# Patient Record
Sex: Female | Born: 1980 | Race: White | Hispanic: No | Marital: Married
Health system: Midwestern US, Community
[De-identification: ages and names within clinical notes are randomized; demographics above are authoritative.]

## PROBLEM LIST (undated history)

## (undated) DIAGNOSIS — I10 Essential (primary) hypertension: Secondary | ICD-10-CM

## (undated) DIAGNOSIS — N6012 Diffuse cystic mastopathy of left breast: Secondary | ICD-10-CM

## (undated) DIAGNOSIS — Z1231 Encounter for screening mammogram for malignant neoplasm of breast: Secondary | ICD-10-CM

## (undated) DIAGNOSIS — R928 Other abnormal and inconclusive findings on diagnostic imaging of breast: Secondary | ICD-10-CM

## (undated) DIAGNOSIS — N898 Other specified noninflammatory disorders of vagina: Secondary | ICD-10-CM

## (undated) DIAGNOSIS — N644 Mastodynia: Secondary | ICD-10-CM

## (undated) DIAGNOSIS — N6011 Diffuse cystic mastopathy of right breast: Secondary | ICD-10-CM

## (undated) DIAGNOSIS — N938 Other specified abnormal uterine and vaginal bleeding: Secondary | ICD-10-CM

## (undated) DIAGNOSIS — R9230 Dense breasts, unspecified: Secondary | ICD-10-CM

## (undated) DIAGNOSIS — R922 Inconclusive mammogram: Secondary | ICD-10-CM

## (undated) DIAGNOSIS — Z01419 Encounter for gynecological examination (general) (routine) without abnormal findings: Secondary | ICD-10-CM

## (undated) DIAGNOSIS — Z1239 Encounter for other screening for malignant neoplasm of breast: Secondary | ICD-10-CM

---

## 2006-04-30 ENCOUNTER — Ambulatory Visit: Payer: Self-pay | Admitting: Gynecology

## 2008-06-20 ENCOUNTER — Emergency Department: Payer: Self-pay | Admitting: Emergency Medicine

## 2008-09-23 ENCOUNTER — Emergency Department: Payer: Self-pay | Admitting: Internal Medicine

## 2009-03-17 ENCOUNTER — Emergency Department: Payer: Self-pay | Admitting: Emergency Medicine

## 2009-08-23 ENCOUNTER — Emergency Department: Payer: Self-pay | Admitting: Emergency Medicine

## 2010-02-09 ENCOUNTER — Ambulatory Visit: Payer: Self-pay | Admitting: Family Medicine

## 2013-01-14 ENCOUNTER — Emergency Department: Payer: Self-pay | Admitting: Emergency Medicine

## 2013-01-14 LAB — COMPREHENSIVE METABOLIC PANEL
Albumin: 4.3 g/dL (ref 3.4–5.0)
Alkaline Phosphatase: 55 U/L (ref 50–136)
Bilirubin,Total: 0.3 mg/dL (ref 0.2–1.0)
Co2: 22 mmol/L (ref 21–32)
Creatinine: 0.86 mg/dL (ref 0.60–1.30)
Glucose: 81 mg/dL (ref 65–99)
Osmolality: 273 (ref 275–301)
SGOT(AST): 23 U/L (ref 15–37)
Sodium: 138 mmol/L (ref 136–145)
Total Protein: 8.3 g/dL — ABNORMAL HIGH (ref 6.4–8.2)

## 2013-01-14 LAB — CBC
HGB: 13.3 g/dL (ref 12.0–16.0)
MCH: 28.3 pg (ref 26.0–34.0)
MCHC: 32.6 g/dL (ref 32.0–36.0)
MCV: 87 fL (ref 80–100)
RBC: 4.7 10*6/uL (ref 3.80–5.20)
RDW: 14 % (ref 11.5–14.5)
WBC: 10.1 10*3/uL (ref 3.6–11.0)

## 2013-01-14 LAB — URINALYSIS, COMPLETE
Bacteria: NONE SEEN
Bilirubin,UR: NEGATIVE
Ketone: NEGATIVE
Nitrite: NEGATIVE
Protein: NEGATIVE
RBC,UR: 1 /HPF (ref 0–5)
Specific Gravity: 1.023 (ref 1.003–1.030)

## 2013-01-14 LAB — LIPASE, BLOOD: Lipase: 75 U/L (ref 73–393)

## 2013-02-19 ENCOUNTER — Emergency Department: Payer: Self-pay | Admitting: Internal Medicine

## 2013-03-14 ENCOUNTER — Emergency Department: Payer: Self-pay | Admitting: Psychiatry

## 2013-03-14 LAB — URINALYSIS, COMPLETE
Bilirubin,UR: NEGATIVE
Glucose,UR: NEGATIVE mg/dL (ref 0–75)
Ketone: NEGATIVE
Protein: 100
RBC,UR: 1059 /HPF (ref 0–5)
Specific Gravity: 1.01 (ref 1.003–1.030)
Squamous Epithelial: 6
WBC UR: 1312 /HPF (ref 0–5)

## 2015-11-12 ENCOUNTER — Emergency Department: Payer: BLUE CROSS/BLUE SHIELD

## 2015-11-12 ENCOUNTER — Emergency Department
Admission: EM | Admit: 2015-11-12 | Discharge: 2015-11-12 | Disposition: A | Discharge status: Home / Self Care | Payer: BLUE CROSS/BLUE SHIELD | Attending: Emergency Medicine | Admitting: Emergency Medicine

## 2015-11-12 DIAGNOSIS — R079 Chest pain, unspecified: Secondary | ICD-10-CM | POA: Diagnosis present

## 2015-11-12 DIAGNOSIS — I10 Essential (primary) hypertension: Secondary | ICD-10-CM | POA: Insufficient documentation

## 2015-11-12 DIAGNOSIS — F17200 Nicotine dependence, unspecified, uncomplicated: Secondary | ICD-10-CM | POA: Diagnosis not present

## 2015-11-12 HISTORY — DX: Essential (primary) hypertension: I10

## 2015-11-12 LAB — CBC
HCT: 40.7 % (ref 35.0–47.0)
HEMOGLOBIN: 13.6 g/dL (ref 12.0–16.0)
MCH: 30 pg (ref 26.0–34.0)
MCHC: 33.5 g/dL (ref 32.0–36.0)
MCV: 89.6 fL (ref 80.0–100.0)
PLATELETS: 252 10*3/uL (ref 150–440)
RBC: 4.54 MIL/uL (ref 3.80–5.20)
RDW: 13.9 % (ref 11.5–14.5)
WBC: 9.7 10*3/uL (ref 3.6–11.0)

## 2015-11-12 LAB — TROPONIN I: Troponin I: <0.03 ng/mL (ref <0.031)

## 2015-11-12 LAB — COMPREHENSIVE METABOLIC PANEL
ALBUMIN: 4.4 g/dL (ref 3.5–5.0)
ALK PHOS: 38 U/L (ref 38–126)
ALT: 13 U/L — ABNORMAL LOW (ref 14–54)
ANION GAP: 8 (ref 5–15)
AST: 16 U/L (ref 15–41)
BILIRUBIN TOTAL: 0.3 mg/dL (ref 0.3–1.2)
BUN: 11 mg/dL (ref 6–20)
CALCIUM: 9.1 mg/dL (ref 8.9–10.3)
CO2: 24 mmol/L (ref 22–32)
CREATININE: 0.89 mg/dL (ref 0.44–1.00)
Chloride: 107 mmol/L (ref 101–111)
Glucose, Bld: 96 mg/dL (ref 65–99)
Potassium: 4.2 mmol/L (ref 3.5–5.1)
SODIUM: 139 mmol/L (ref 135–145)
TOTAL PROTEIN: 7.7 g/dL (ref 6.5–8.1)

## 2015-11-12 LAB — FIBRIN DERIVATIVES D-DIMER (ARMC ONLY): FIBRIN DERIVATIVES D-DIMER (ARMC): 263 (ref 0–499)

## 2015-11-12 MED ORDER — ALUM & MAG HYDROXIDE-SIMETH 200-200-20 MG/5ML PO SUSP
15.0000 mL | ORAL | Status: AC
Start: 2015-11-12 — End: 2015-11-12
  Administered 2015-11-12: 15 mL via ORAL
  Filled 2015-11-12: qty 30

## 2015-11-12 MED ORDER — HYDROCHLOROTHIAZIDE 12.5 MG PO CAPS
12.5000 mg | ORAL_CAPSULE | Freq: Every day | ORAL | Status: DC
  Administered 2015-11-12: 12.5 mg via ORAL
  Filled 2015-11-12: qty 1

## 2015-11-12 MED ORDER — ASPIRIN 81 MG PO CHEW
324.0000 mg | CHEWABLE_TABLET | Freq: Once | ORAL | Status: AC
  Administered 2015-11-12: 324 mg via ORAL
  Filled 2015-11-12: qty 4

## 2015-11-12 NOTE — ED Provider Notes (Signed)
[facility] Emergency Department Provider Note  ____________________________________________  Time seen: Approximately 1:33 PM  I have reviewed the triage vital signs and the nursing notes.   HISTORY  Chief Complaint Chest Pain    HPI Kellie West is a 35 y.o. female presents for evaluation of intermittent "contraction" like discomfort that she is been experiencing across the chest for about the last 4 days.  Patient reports that throughout the day she feels a slight spasming or contraction pain across her chest. It is not associated with exertion, and she reports that was relieved somewhat with Tums that she felt like it may be indigestion.  She does have a history of high blood pressure and is a smoker from time to time, but she denies any history of heart disease. Denies early-onset heart disease in the family. No history of any previous blood clots, leg swelling, cough, or trouble breathing. She does take estrogen.  The present time she reports she is not having any further symptoms.   Past Medical History  Diagnosis Date  . Hypertension     There are no active problems to display for this patient.   No past surgical history on file.  No current outpatient prescriptions on file.  Allergies Review of patient's allergies indicates no known allergies.  No family history on file.  Social History Social History  Substance Use Topics  . Smoking status: Current Some Day Smoker  . Smokeless tobacco: Not on file  . Alcohol Use: No    Review of Systems Constitutional: No fever/chills Eyes: No visual changes. ENT: No sore throat. Cardiovascular: See history of present illness Respiratory: Denies shortness of breath. Gastrointestinal: No abdominal pain.  No nausea, no vomiting.  No diarrhea.  No constipation. Genitourinary: Negative for dysuria. Musculoskeletal: Negative for back pain. Skin: Negative for rash. Neurological: Negative for  headaches, focal weakness or numbness.  10-point ROS otherwise negative.  ____________________________________________   PHYSICAL EXAM:  VITAL SIGNS: ED Triage Vitals  Enc Vitals Group     BP 11/12/15 0839 160/104 mmHg     Pulse Rate 11/12/15 0831 84     Resp --      Temp 11/12/15 0831 98.7 F (37.1 C)     Temp Source 11/12/15 0831 Oral     SpO2 11/12/15 0831 99 %     Weight 11/12/15 0831 180 lb (81.647 kg)     Height 11/12/15 0831  (1.651 m)     Head Cir --      Peak Flow --      Pain Score --      Pain Loc --      Pain Edu? --      Excl. in GC? --    Constitutional: Alert and oriented. Well appearing and in no acute distress. Very pleasant, amicable seated comfortably in stretcher. Eyes: Conjunctivae are normal. PERRL. Head: Atraumatic. Nose: No congestion/rhinnorhea. Mouth/Throat: Mucous membranes are moist.  Oropharynx non-erythematous. Neck: No stridor.   Cardiovascular: Normal rate, regular rhythm. Grossly normal heart sounds.  Good peripheral circulation. Respiratory: Normal respiratory effort.  No retractions. Lungs CTAB. Gastrointestinal: Soft and nontender. No distention. Negative Murphy. Musculoskeletal: No lower extremity tenderness nor edema.  Neurologic:  Normal speech and language. No gross focal neurologic deficits are appreciated. No gait instability. Skin:  Skin is warm, dry and intact. No rash noted. Psychiatric: Mood and affect are normal. Speech and behavior are normal.  ____________________________________________   LABS (all labs ordered are listed, but  only abnormal results are displayed)  Labs Reviewed  COMPREHENSIVE METABOLIC PANEL - Abnormal; Notable for the following:    ALT 13 (*)    All other components within normal limits  CBC  TROPONIN I  FIBRIN DERIVATIVES D-DIMER ([facility] ONLY)  TROPONIN I   ____________________________________________  EKG  Reviewed and interpreted me at 8:25 AM Treatment rate 90 PR 150 QRS 80 QTc  440 No evidence of acute ischemic T-wave abnormality, patient does have fairly large voltage along V1 through V3 which would be suspicious for possible left ventricular hypertrophy. No evidence of acute ischemic abnormality. ____________________________________________  RADIOLOGY      DG Chest 2 View (Final result) Result time: 11/12/15 13:48:46   Final result by Rad Results In Interface (11/12/15 13:48:46)   Narrative:   CLINICAL DATA: 35 year old female with chest pain for 5 days.  EXAM: CHEST 2 VIEW  COMPARISON: None.  FINDINGS: The cardiomediastinal silhouette is unremarkable.  There is no evidence of focal airspace disease, pulmonary edema, suspicious pulmonary nodule/mass, pleural effusion, or pneumothorax. No acute bony abnormalities are identified.  IMPRESSION: No active cardiopulmonary disease.   Electronically Signed By: Harmon Pier M.D. On: 11/12/2015 13:48    ____________________________________________   PROCEDURES  Procedure(s) performed: None  Critical Care performed: No  ____________________________________________   INITIAL IMPRESSION / ASSESSMENT AND PLAN / ED COURSE  Pertinent labs & imaging results that were available during my care of the patient were reviewed by me and considered in my medical decision making (see chart for details).  Patient presents for intermittent chest discomfort. Atypical in its symptoms and that is been off and on, associated with mild squeezing discomfort but not associated with exertional or other acute concerns. It is not radiating that she did report that she did feel sweaty with an episode earlier during the morning. Presently asymptomatic. EKG reassuring for no acute ischemic abnormality, 2 negative troponins. She is low risk by well's clinical criteria for pulmonary embolism, and her d-dimer is less than 500. In addition the patient has no signs or symptoms of acute aortic dissection. No ripping tearing  or moving pain. Chest x-ray normal. No evidence of abdominal pain.  Discussed with cardiology, patient sit up with a plan with Dr. Lady Gary Wednesday. Patient agreeable to follow-up and close return precautions. Currently pain and symptom free. Low risk by heart score.  Return precautions and treatment recommendations and follow-up discussed with the patient who is agreeable with the plan.  ____________________________________________   FINAL CLINICAL IMPRESSION(S) / ED DIAGNOSES  Final diagnoses:  Chest pain with low risk for cardiac etiology      Sharyn Creamer, MD 11/12/15 [phone]

## 2015-11-12 NOTE — ED Notes (Addendum)
Patient states that on Sunday night she started having chest pain, Pain is located in the center of her chest, does not radiate, pain comes and goes, feels like "a contraction but in my chest". Pain wakes her up at night, patient states that when she has the pain she breaks out in sweats. Denies Shortness of breath or nausea.   Patient has a hx/o hypertension but does not take any medication for it.

## 2015-11-12 NOTE — ED Notes (Signed)
Pt drove here this am  She reports  "I have been having this pain in the middle of my chest - it has been waking me up and I get really sweaty"  0/10 pain upon arrival

## 2015-11-12 NOTE — Discharge Instructions (Signed)
You have been seen in the Emergency Department (ED) today for chest pain.  As we have discussed today’s test results are normal, but you may require further testing. ° °Please follow up with the recommended doctor as instructed above in these documents regarding today’s emergent visit and your recent symptoms to discuss further management.  Continue to take your regular medications. If you are not doing so already, please also take a daily baby aspirin (81 mg), at least until you follow up with your doctor. ° °Return to the Emergency Department (ED) if you experience any further chest pain/pressure/tightness, difficulty breathing, or sudden sweating, or other symptoms that concern you. ° ° °Chest Pain Observation °It is often hard to give a specific diagnosis for the cause of chest pain. Among other possibilities your symptoms might be caused by inadequate oxygen delivery to your heart (angina). Angina that is not treated or evaluated can lead to a heart attack (myocardial infarction) or death. °Blood tests, electrocardiograms, and X-rays may have been done to help determine a possible cause of your chest pain. After evaluation and observation, your health care provider has determined that it is unlikely your pain was caused by an unstable condition that requires hospitalization. However, a full evaluation of your pain may need to be completed, with additional diagnostic testing as directed. It is very important to keep your follow-up appointments. Not keeping your follow-up appointments could result in permanent heart damage, disability, or death. If there is any problem keeping your follow-up appointments, you must call your health care provider. °HOME CARE INSTRUCTIONS  °Due to the slight chance that your pain could be angina, it is important to follow your health care provider's treatment plan and also maintain a healthy lifestyle: °· Maintain or work toward achieving a healthy weight. °· Stay physically active  and exercise regularly. °· Decrease your salt intake. °· Eat a balanced, healthy diet. Talk to a dietitian to learn about heart-healthy foods. °· Increase your fiber intake by including whole grains, vegetables, fruits, and nuts in your diet. °· Avoid situations that cause stress, anger, or depression. °· Take medicines as advised by your health care provider. Report any side effects to your health care provider. Do not stop medicines or adjust the dosages on your own. °· Quit smoking. Do not use nicotine patches or gum until you check with your health care provider. °· Keep your blood pressure, blood sugar, and cholesterol levels within normal limits. °· Limit alcohol intake to no more than 1 drink per day for women who are not pregnant and 2 drinks per day for men. °· Do not abuse drugs. °SEEK IMMEDIATE MEDICAL CARE IF: °You have severe chest pain or pressure which may include symptoms such as: °· You feel pain or pressure in your arms, neck, jaw, or back. °· You have severe back or abdominal pain, feel sick to your stomach (nauseous), or throw up (vomit). °· You are sweating profusely. °· You are having a fast or irregular heartbeat. °· You feel short of breath while at rest. °· You notice increasing shortness of breath during rest, sleep, or with activity. °· You have chest pain that does not get better after rest or after taking your usual medicine. °· You wake from sleep with chest pain. °· You are unable to sleep because you cannot breathe. °· You develop a frequent cough or you are coughing up blood. °· You feel dizzy, faint, or experience extreme fatigue. °· You develop severe weakness, dizziness, fainting,   or chills. °Any of these symptoms may represent a serious problem that is an emergency. Do not wait to see if the symptoms will go away. Call your local emergency services (911 in the [place] Do not drive yourself to the hospital. °MAKE SURE YOU: °· Understand these instructions. °· Will watch your  condition. °· Will get help right away if you are not doing well or get worse. °  °This information is not intended to replace advice given to you by your health care provider. Make sure you discuss any questions you have with your health care provider. °  °Document Released: 10/21/2010 Document Revised: 09/23/2013 Document Reviewed: 03/20/2013 °Elsevier Interactive Patient Education ©2016 [employer] ° °

## 2017-02-01 ENCOUNTER — Ambulatory Visit
Admit: 2017-02-01 | Discharge: 2017-02-01 | Payer: PRIVATE HEALTH INSURANCE | Attending: Obstetrics & Gynecology | Primary: Internal Medicine

## 2017-02-01 ENCOUNTER — Encounter

## 2017-02-01 DIAGNOSIS — Z01419 Encounter for gynecological examination (general) (routine) without abnormal findings: Secondary | ICD-10-CM

## 2017-02-01 LAB — CBC WITH AUTO DIFFERENTIAL
Basophils %: 1.1 %
Basophils Absolute: 0.1 10*3/uL (ref 0.0–0.2)
Eosinophils %: 3 %
Eosinophils Absolute: 0.2 10*3/uL (ref 0.0–0.7)
Hematocrit: 37.7 % (ref 37.0–47.0)
Hemoglobin: 12.7 g/dL (ref 12.0–16.0)
Lymphocytes %: 45.2 %
Lymphocytes Absolute: 2.5 10*3/uL (ref 1.0–4.8)
MCH: 31.3 pg (ref 27.0–31.3)
MCHC: 33.7 % (ref 33.0–37.0)
MCV: 93 fL (ref 82.0–100.0)
Monocytes %: 8.8 %
Monocytes Absolute: 0.5 10*3/uL (ref 0.2–0.8)
Neutrophils %: 41.9 %
Neutrophils Absolute: 2.3 10*3/uL (ref 1.4–6.5)
Platelets: 194 10*3/uL (ref 130–400)
RBC: 4.06 M/uL — ABNORMAL LOW (ref 4.20–5.40)
RDW: 12.7 % (ref 11.5–14.5)
WBC: 5.4 10*3/uL (ref 4.8–10.8)

## 2017-02-01 LAB — T4, FREE: T4 Free: 1.01 ng/dL (ref 0.93–1.70)

## 2017-02-01 LAB — TSH: TSH: 2.39 u[IU]/mL (ref 0.270–4.200)

## 2017-02-01 LAB — LUTEINIZING HORMONE: LH: 7.6 m[IU]/mL

## 2017-02-01 LAB — HCG, QUANTITATIVE, PREGNANCY: hCG Quant: <0.1 m[IU]/mL

## 2017-02-01 LAB — PROLACTIN: Prolactin: 8.2 ng/mL

## 2017-02-01 LAB — FOLLICLE STIMULATING HORMONE: FSH: 3.8 m[IU]/mL

## 2017-02-01 NOTE — Patient Instructions (Signed)
ENDOSEE - OFFICE HYSTEROSCOPY    Endosee Office Hysteroscopy is a diagnostic tool that lets your doctor see the inside of your uterus quickly and easily right in her office, without the need for general anesthesia in an operating room.  Endosee helps find the cause of several common issues women face such as abnormal bleeding and infertility concerns.  Before using Endosee, your doctor will need to look at your health history, but most women are able to have the procedure without a problem.    Do I need to do anything prior to my Endosee?  1.  No unprotected intercourse for 3 weeks prior to procedure  2.  Take 800mg of Motrin 1 hour prior to your procedure  3.  If you start your period, you can still have the Endosee done if your bleeding is very light.  If your cycle is heavy please call to reschedule.  4.  Avoid vaginal medications or creams & douching for 24 hours before the procedure.  5. Please come prepared to leave a urine sample prior to your procedure.    What can I expect when I go home?  1.  You may have some spotting or light discharge for up to 24 hours.  2.  You can resume all normal activities.  3.  The procedure may start your period.    *If a biopsy is done, you will need to schedule a follow up appointment for 2 weeks following your procedure to get your results from the physician.          EMBX---      You have been scheduled for an Endometrial Biopsy (EMBX).  An EMBX is a procedure that involves using a soft, straw-like device to suction a small sample of lining from the uterus.  An EMBX is done to find the cause of heavy, prolonged, or irregular uterine bleeding.  It is also done to find the cause of uterine bleeding in women who have gone through menopause.    Do I need anything prior to my EMBX?  1.  No unprotected intercourse for 3-4 weeks prior to procedure  2.  Take 800mg of Motrin 1 hour prior to your procedure  3.  If you start your period you can still have the procedure if you are  having light bleeding such as the first or last day of your cycle.    What can I expect when I go home?  1.  You may have pink or red tinged discharge after the procedure for up to 24 hours.  2.  This procedure may cause your period to start.  3.  If you have heavy bleeding (filling an overnight pad in 1 hour or less for 3 hrs), a fever of 100 degrees (or higher), or abdominal pain that is debilitating, please call the office immediately at [phone].

## 2017-02-01 NOTE — Progress Notes (Signed)
History and Physical  [facility] [place]  [address] [place], [place] [address]  P: [phone] / F: [phone]  Lindsay Lawson  02/01/2017              36 y.o.  Chief Complaint   Patient presents with   . Endometriosis     Pt reports periods lasting 10-20 days with "insane" cramps. c/o heavy bleeding/blood clot with severe abd pain.    . New Patient   . Dyspareunia     pt states she has pain with sex. states at times she has to refrain at time from interocourse.        BP 110/60   Ht 5\' 11"  (1.803 m)   Wt 175 lb 12.8 oz (79.7 kg)   LMP 01/14/2017   BMI 24.52 kg/m   Alllergies:  Latex; Adhesive tape; Cefaclor; Nsaids; and Kiwi extract               Primary Care Physician: No primary care provider on file.    HPI : Lindsay Lawson is a 36 y.o. female [phone] The patient was seen and examined. She has no chief complaint today and is here for her annual exam.  Her bowels are regular. There are no voiding complaints. She denies any bloating.  She denies vaginal discharge and was counseled on STD's and the need for barrier contraception. All ?s answered.     Pt would also like to discuss painful intercourse, and irregular periods. pt states it really hurts when she has intercourse, she has had bleeding, spotting, leaking of fluid. She states she has been having really bad bleeding and cramps with her periods, often passing large blood clots. Reports severe abdominal pain at times, pain will often radiate to her back at times. Pt reports having these sx since the birth of her last child in 2011. Risks, benefits and alternative therapies for treatment discussed. DUB evaluation recommended, labs and pelvic sono ordered. Pt to RTO for endosee and EMBX. ALL?s answered ______________________________________________________________  Obstetric History    G4   P3   T1   P2   A1   L3     SAB1   TAB0   Ectopic0   Molar0   Multiple0   Live Births3       # Outcome Date GA Lbr Len/2nd  Weight Sex Delivery Anes PTL Lv   4 Preterm 03/15/10 [redacted]w[redacted]d   F CS-Unspec EPI  LIV   3 Term 09/14/08 [redacted]w[redacted]d   M Vag-Spont EPI  LIV   2 Preterm 03/02/02 [redacted]w[redacted]d   F Vag-Spont EPI  LIV      Complications: Placenta abruptio   1 SAB                 Past Medical History:   Diagnosis Date   . Anxiety    . Arthritis    . Back pain with sciatica    . DDD (degenerative disc disease), lumbar    . Fibromyalgia    . History of kidney stones    . IBS (irritable bowel syndrome)    . 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 Lupus (systemic lupus erythematosus) (HCC)    . Migraines    . PTSD (post-traumatic stress disorder)    . Scoliosis    . Secondary osteoarthritis  Past Surgical History:   Procedure Laterality Date   . CESAREAN SECTION     . KNEE SURGERY Right     nerve block---    . PATELLA SURGERY Right     patella alignment   . TUBAL LIGATION     . URETER STENT PLACEMENT Bilateral     x6      Family History   Problem Relation Age of Onset   . Arthritis Mother      RA   . Diabetes Father    . Obesity Father    . Sleep Apnea Father    . Hypertension Father    . Elevated Lipids Father    . Other Father      had HEP C   . Liver Cancer Father    . Other Maternal Grandmother      aspestos exposure    . Cancer Maternal Grandfather      aspestos cancer   . Kidney Disease Paternal Grandmother    . Diabetes Paternal Grandfather 51     Social History     Social History   . Marital status: Single     Spouse name: N/A   . Number of children: N/A   . Years of education: N/A     Occupational History   . Not on file.     Social History Main Topics   . Smoking status: Former Games developer   . Smokeless tobacco: Never Used      Comment: quit 2008   . Alcohol use No   . Drug use: No   . Sexual activity: Yes     Partners: Male     Other Topics Concern   . Not on file     Social History Narrative   . No narrative on file         MEDICATIONS:  No current outpatient  prescriptions on file prior to visit.     No current facility-administered medications on file prior to visit.          ALLERGIES:  Allergies as of 02/01/2017 - Review Complete 02/01/2017   Allergen Reaction Noted   . Latex Rash 10/01/2006   . Adhesive tape Hives and Other (See Comments) 02/23/2016   . Cefaclor  10/01/2006   . Nsaids Other (See Comments) 12/24/2015   . Kiwi extract Rash 06/01/2014           Gynecologic History:     Patient's last menstrual period was 01/14/2017.      ________________________________________________________________________  REVIEW OF SYSTEMS:       Review of Systems   Constitutional: Negative for chills, fatigue, fever and unexpected weight change.   HENT: Negative for hearing loss, sinus pressure and tinnitus.    Eyes: Negative for visual disturbance.   Respiratory: Negative for cough, shortness of breath and wheezing.    Cardiovascular: Negative for chest pain, palpitations and leg swelling.   Gastrointestinal: Positive for abdominal pain. Negative for blood in stool, constipation, nausea and vomiting.   Genitourinary: Positive for dyspareunia, menstrual problem, pelvic pain, vaginal bleeding and vaginal pain. Negative for difficulty urinating, dysuria, flank pain, hematuria and vaginal discharge.   Musculoskeletal: Positive for back pain. Negative for arthralgias and neck stiffness.   Skin: Negative for color change, pallor and rash.   Allergic/Immunologic: Negative for food allergies.   Neurological: Negative for dizziness, speech difficulty, weakness and numbness.   Psychiatric/Behavioral: Negative for behavioral problems, self-injury and suicidal ideas. The patient is not nervous/anxious.  PHYSICAL Exam:    Constitutional:  Vitals:    02/01/17 1337   BP: 110/60   Weight: 175 lb 12.8 oz (79.7 kg)   Height: 5\' 11"  (1.803 m)        Physical Exam   Constitutional: She is oriented to person, place, and time. She appears well-developed and well-nourished.   HENT:   Left Ear: External ear normal.   Nose: Nose normal.   Eyes: Conjunctivae and EOM are normal.   Neck: Normal range of motion.   Cardiovascular: Normal rate and regular rhythm.    Pulmonary/Chest: Effort normal and breath sounds normal. Right breast exhibits no inverted nipple, no mass, no nipple discharge, no skin change and no tenderness. Left breast exhibits no inverted nipple, no mass, no nipple discharge, no skin change and no tenderness. Breasts are symmetrical.   Abdominal: Soft. Bowel sounds are normal. There is tenderness.   Genitourinary: Vagina normal and uterus normal. Rectal exam shows no external hemorrhoid and guaiac negative stool. There is no rash, lesion or injury on the right labia. There is no rash, lesion or injury on the left labia. Cervix exhibits no discharge. No erythema or tenderness in the vagina. No foreign body in the vagina. No signs of injury around the vagina. No vaginal discharge found.   Musculoskeletal: Normal range of motion.   Neurological: She is alert and oriented to person, place, and time. She has normal reflexes.   Skin: Skin is warm. No lesion and no rash noted. No erythema.   Psychiatric: She has a normal mood and affect. Her speech is normal and behavior is normal. Judgment and thought content normal. Her mood appears not anxious. She expresses no homicidal and no suicidal ideation.         ASSESSMENT:      36 y.o. Annual   Diagnosis Orders   1. Encounter for well woman exam with routine gynecological exam  PAP SMEAR   2. Encounter for screening for human papillomavirus (HPV)  PAP SMEAR   3. DUB (dysfunctional uterine bleeding)  CBC Auto Differential    Follicle Stimulating Hormone    HCG, Quantitative, Pregnancy    Luteinizing Hormone    Prolactin    T4, Free    TSH without Reflex    US PELVIS COMPLETE    [place] NON OB TRANSVAGINAL   4.  Screening mammogram, encounter for  MAM Screening Bilateral                  Hereditary Breast, Ovarian, Colon and Uterine Cancer screening Done.          Tobacco & Secondary smoke risks reviewed; instructed on cessation and avoidance      Counseling Completed:          PLAN:    Return for ENODSEE/EMBX.  Repeat Annual every 1 year  Cervical Cytology Evaluation begins at 37 years old.  If Negative Cytology, Follow-up screening per current guidelines.   Mammograms every 1 year. If 36 yo and last mammogram was negative.  Calcium and Vitamin D dosing reviewed.  Colonoscopy screening reviewed as well as onset for bone density testing.  Birth control and barrier recommendations discussed.  STD counseling and prevention reviewed.  Gardisil counseling completed for all patients 9-26 yo.  Routine health maintenance per patients PCP.    Orders Placed This Encounter   Procedures   . MAM Screening Bilateral     Standing Status:   Future     Standing Expiration Date:  04/03/2018   . US PELVIS COMPLETE     Standing Status:   Future     Standing Expiration Date:   02/01/2018   . US NON OB TRANSVAGINAL     Standing Status:   Future     Standing Expiration Date:   02/01/2018   . CBC Auto Differential     Standing Status:   Future     Standing Expiration Date:   02/01/2018   . Follicle Stimulating Hormone     Standing Status:   Future     Standing Expiration Date:   02/01/2018   . HCG, Quantitative, Pregnancy     Standing Status:   Future     Standing Expiration Date:   02/01/2018   . Luteinizing Hormone     Standing Status:   Future     Standing Expiration Date:   02/01/2018   . Prolactin     Standing Status:   Future     Standing Expiration Date:   02/01/2018   . T4, Free     Standing Status:   Future     Standing Expiration Date:   02/01/2018   . TSH without Reflex     Standing Status:   Future     Standing Expiration Date:   02/01/2018   . PAP SMEAR     Patient History:    Patient's last menstrual period was 01/14/2017.  OBGYN Status: Having  periods  Past Surgical History:  No date: CESAREAN SECTION  No date: KNEE SURGERY Right      Comment: nerve block---   No date: PATELLA SURGERY Right      Comment: patella alignment  No date: TUBAL LIGATION  No date: URETER STENT PLACEMENT Bilateral      Comment: x6       Smoking status: Former Smoker                                                              Packs/day: 0.00      Years: 0.00      Smokeless tobacco: Never Used                      Comment: quit 2008       Standing Status:   Future     Standing Expiration Date:   02/01/2018     Order Specific Question:   Collection Type     Answer:   SurePath     Order Specific Question:   Prior Abnormal Pap Test     Answer:   No     Order Specific Question:   Screening or Diagnostic     Answer:   Screening     Order Specific Question:   HPV Requested?     Answer:   HPV Typing with HPV 16/18     Order Specific Question:   High Risk Patient     Answer:   N/A     No orders of the defined types were placed in this encounter.      I, Leda QuailBritney H Lakner, am scribing for, and in the presence of, Dr Conni Elliotorie Nahlia Hellmann. Electronically signed by: Leda QuailBritney H Lakner 02/01/17 2:23 PM  I, Dr Conni Elliotorie Kain Milosevic, personally performed the services described in this documentation,  as scribed by Leda Quail in my presence, and it is both accurate and complete. Electronically signed by: Conni Elliot, MD

## 2017-02-08 ENCOUNTER — Encounter

## 2017-02-26 ENCOUNTER — Ambulatory Visit: Payer: PRIVATE HEALTH INSURANCE | Primary: Internal Medicine

## 2017-02-26 ENCOUNTER — Encounter: Primary: Internal Medicine

## 2017-02-27 ENCOUNTER — Encounter

## 2017-02-27 ENCOUNTER — Inpatient Hospital Stay: Admit: 2017-02-27 | Payer: PRIVATE HEALTH INSURANCE | Primary: Internal Medicine

## 2017-02-27 DIAGNOSIS — Z1231 Encounter for screening mammogram for malignant neoplasm of breast: Secondary | ICD-10-CM

## 2017-02-28 ENCOUNTER — Encounter: Attending: Obstetrics & Gynecology | Primary: Internal Medicine

## 2017-03-02 ENCOUNTER — Encounter

## 2017-03-06 NOTE — Telephone Encounter (Signed)
Please advise pt that Dr Marcelline Mateskovach is out of the office until Thursday June 7th

## 2017-03-06 NOTE — Telephone Encounter (Signed)
Pt wants to know if she could start taking Methotrexate Sodium, PF, 200 MG/8ML SOLN. Her appt was rescheduled to 6/20.

## 2017-03-07 ENCOUNTER — Encounter: Attending: Obstetrics & Gynecology | Primary: Internal Medicine

## 2017-03-08 NOTE — Telephone Encounter (Signed)
Pt is aware that Dr. Marcelline MatesKovach will be in the office tomorrow

## 2017-03-09 NOTE — Telephone Encounter (Signed)
Per dr Marcelline Mateskovach pt should not start on medication until after procedures on the 20th

## 2017-03-09 NOTE — Telephone Encounter (Signed)
Pt is aware

## 2017-03-16 ENCOUNTER — Inpatient Hospital Stay: Admit: 2017-03-16 | Payer: PRIVATE HEALTH INSURANCE | Primary: Internal Medicine

## 2017-03-16 ENCOUNTER — Encounter: Primary: Internal Medicine

## 2017-03-16 ENCOUNTER — Ambulatory Visit: Payer: PRIVATE HEALTH INSURANCE | Primary: Internal Medicine

## 2017-03-16 ENCOUNTER — Encounter

## 2017-03-16 DIAGNOSIS — R928 Other abnormal and inconclusive findings on diagnostic imaging of breast: Secondary | ICD-10-CM

## 2017-03-21 ENCOUNTER — Encounter

## 2017-03-21 ENCOUNTER — Ambulatory Visit
Admit: 2017-03-21 | Discharge: 2017-03-21 | Payer: PRIVATE HEALTH INSURANCE | Attending: Obstetrics & Gynecology | Primary: Internal Medicine

## 2017-03-21 DIAGNOSIS — N938 Other specified abnormal uterine and vaginal bleeding: Secondary | ICD-10-CM

## 2017-03-21 LAB — POCT URINE PREGNANCY: Preg Test, Ur: NEGATIVE

## 2017-03-21 NOTE — Progress Notes (Signed)
History and Physical  [facility] [place]  [address] [place], [place] [address]  P: [phone] / F: [phone]  Lindsay Lawson  03/21/2017              36 y.o.  Chief Complaint   Patient presents with   . Procedure     ENDOSEE FOR DUB   . Student   . Results       BP 100/64   Ht 5\' 11"  (1.803 m)   Wt 169 lb (76.7 kg)   LMP 03/10/2017   BMI 23.57 kg/m   Alllergies:  Latex; Adhesive tape; Cefaclor; Nsaids; and Kiwi extract               Primary Care Physician: Asa Lente, MD    HPI : Lindsay Lawson 36 y.o. [phone] female  Pt here for results of labs and [place] Lab discussed wnl. Pelvic US discussed, wnl.     Risks, benefits and alternative therapies for treatment discussed. Pt elects DUB evaluation. PT to have endosee/embx in office today.         PROCEDURE NOTE:  Risks, benefits and alternative therapies for vaginal bleeding evaluation discussed. Pt agrees to [facility] with EMB and consent obtained. Pt advised of associated risks and complications. Pt gives verbal agreement to proceed with procedure.     Speculum was placed and the cervix was visualized. Betadine is applied if not allergic. The cervix was grasped with tenaculum.The uterus sounded to 9cm and the uterine Pipelle was used. Three passes were performed. Moderate tissue noted. The tissue was sent to pathology. Os finders utilized as needed for gentle dilation. The endosee hysteroscopic device is easily passed through the cervical os and sterile saline is utilized to visualize the entire uterine cavity which is inspected with the following findings:  Bilateral tubal ostia visualized, no suspicious lesions noted, atrophic endometrium.    Hysteroscopy is adequate.  Patient tolerated the procedure well.    Findings: no abnormal findings   Plan: f/u in 2-3wks for tx plan.         ________________________________________________________________________  Obstetric History    G4   P3   T1   P2   A1   L3      SAB1   TAB0   Ectopic0   Molar0   Multiple0   Live Births3       # Outcome Date GA Lbr Len/2nd Weight Sex Delivery Anes PTL Lv   4 Preterm 03/15/10 [redacted]w[redacted]d   F CS-Unspec EPI  LIV   3 Term 09/14/08 [redacted]w[redacted]d   M Vag-Spont EPI  LIV   2 Preterm 03/02/02 [redacted]w[redacted]d   F Vag-Spont EPI  LIV      Complications: Placenta abruptio   1 SAB                 Past Medical History:   Diagnosis Date   . Anxiety    . Arthritis    . Back pain with sciatica    . DDD (degenerative disc disease), lumbar    . Fibromyalgia    . Hashimoto's disease    . History of kidney stones    . IBS (irritable bowel syndrome)    . 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 Lupus (systemic lupus erythematosus) (HCC)    . Migraines    . PTSD (post-traumatic stress disorder)    . Scoliosis    . Secondary osteoarthritis  Past Surgical History:   Procedure Laterality Date   . CESAREAN SECTION     . KNEE SURGERY Right     nerve block---    . PATELLA SURGERY Right     patella alignment   . TUBAL LIGATION     . URETER STENT PLACEMENT Bilateral     x6      Family History   Problem Relation Age of Onset   . Arthritis Mother      RA   . Diabetes Father    . Obesity Father    . Sleep Apnea Father    . Hypertension Father    . Elevated Lipids Father    . Other Father      had HEP C   . Liver Cancer Father    . Other Maternal Grandmother      aspestos exposure    . Cancer Maternal Grandfather      aspestos cancer   . Kidney Disease Paternal Grandmother    . Diabetes Paternal Grandfather [phone]     Social History     Social History   . Marital status: Single     Spouse name: N/A   . Number of children: N/A   . Years of education: N/A     Occupational History   . Not on file.     Social History Main Topics   . Smoking status: Former Games developermoker   . Smokeless tobacco: Never Used      Comment: quit 2008   . Alcohol use No   . Drug use: No   . Sexual activity: Yes     Partners: Male     Other Topics Concern    . Not on file     Social History Narrative   . No narrative on file         MEDICATIONS:  Current Outpatient Prescriptions on File Prior to Visit   Medication Sig Dispense Refill   . SUMAtriptan (IMITREX) 100 MG tablet TAKE 1 TABLET AS NEEDED (max 2 (TWO) per day/12 per month)     . Methotrexate Sodium, PF, 200 MG/8ML SOLN 1ml sq injection once a week. No alcohol. Hold if on antibiotics or ill.     Marland Kitchen. EPINEPHrine (EPIPEN) 0.3 MG/0.3ML SOAJ injection Inject 0.3 mg into the muscle     . amitriptyline (ELAVIL) 10 MG tablet Take 1-2 tablets by mouth daily at bedtime.     . busPIRone (BUSPAR) 10 MG tablet TAKE 1 TABLET THREE TIMES DAILY     . citalopram (CELEXA) 40 MG tablet Take 1 tablet by mouth once daily.     . predniSONE (DELTASONE) 20 MG tablet Take 40 mg by mouth     . dicyclomine (BENTYL) 20 MG tablet TAKE 1 TABLET BY MOUTH THREE TIMES DAILY AS NEEDED     . glycopyrrolate (ROBINUL) 2 MG tablet TAKE 1 TABLET TWICE DAILY     . tiZANidine (ZANAFLEX) 4 MG tablet TAKE 1 TABLET EVERY 8 HOURS AS NEEDED     . pregabalin (LYRICA) 225 MG capsule Take 225 mg by mouth..     . folic acid (FOLVITE) 1 MG tablet Take 1 mg by mouth     . ondansetron (ZOFRAN-ODT) 4 MG disintegrating tablet Take 4 mg by mouth     . omeprazole (PRILOSEC) 40 MG delayed release capsule TAKE 1 CAPSULE TWICE DAILY     . tretinoin (RETIN-A) 0.025 % cream Use sparingly to acne-prone areas at bedtime.  No current facility-administered medications on file prior to visit.          ALLERGIES:  Allergies as of 03/21/2017 - Review Complete 03/21/2017   Allergen Reaction Noted   . Latex Rash 10/01/2006   . Adhesive tape Hives and Other (See Comments) 02/23/2016   . Cefaclor  10/01/2006   . Nsaids Other (See Comments) 12/24/2015   . Kiwi extract Rash 06/01/2014           Gynecologic History:     Patient's last menstrual period was 03/10/2017.      ________________________________________________________________________  REVIEW OF SYSTEMS:       Review of  Systems   Respiratory: Negative for cough, shortness of breath and wheezing.    Cardiovascular: Negative for chest pain, palpitations and leg swelling.   Gastrointestinal: Negative for abdominal pain, blood in stool, constipation and diarrhea.   Genitourinary: Positive for menstrual problem and vaginal bleeding. Negative for difficulty urinating, dysuria, flank pain and hematuria.   Skin: Negative.    Psychiatric/Behavioral: Negative.                                                                                                                                                   PHYSICAL Exam:    Constitutional:  Vitals:    03/21/17 1116   BP: 100/64   Weight: 169 lb (76.7 kg)   Height: 5\' 11"  (1.803 m)       Physical Exam   Constitutional: She is oriented to person, place, and time. She appears well-developed and well-nourished.   HENT:   Head: Normocephalic and atraumatic.   Cardiovascular: Normal rate and regular rhythm.    Abdominal: Soft. Normal appearance. There is no tenderness.   Musculoskeletal: Normal range of motion.   Neurological: She is alert and oriented to person, place, and time.   Skin: Skin is warm and intact. No lesion noted. No erythema.   Psychiatric: She has a normal mood and affect. Her behavior is normal. Judgment and thought content normal.         ASSESSMENT:      36 y.o. Annual   Diagnosis Orders   1. DUB (dysfunctional uterine bleeding)  PR HYSTEROSCOPY,W/ENDO BX    POCT urine pregnancy   2. Negative pregnancy test                      Counseling Completed:          PLAN:      Orders Placed This Encounter   Procedures   . POCT urine pregnancy     No orders of the defined types were placed in this encounter.        I, Lindsay Lawson, am scribing for, and in the presence of, Dr Conni Elliot. Electronically signed by: Lindsay Lawson 03/21/17 12:23 PM  I, Dr Conni Elliot, personally performed the services described in this documentation, as scribed by Lindsay Lawson in my presence,  and it is both accurate and complete. Electronically signed by: Conni Elliot, MD

## 2017-03-21 NOTE — Progress Notes (Signed)
 A timeout was performed immediately prior to the start of the ENDOSEE procedure and included the correct patient (two identifiers), correct procedure and correct site(s).  Procedure consent and allergies were also verified.

## 2017-04-10 ENCOUNTER — Ambulatory Visit
Admit: 2017-04-10 | Discharge: 2017-04-10 | Payer: PRIVATE HEALTH INSURANCE | Attending: Obstetrics & Gynecology | Primary: Internal Medicine

## 2017-04-10 DIAGNOSIS — N938 Other specified abnormal uterine and vaginal bleeding: Secondary | ICD-10-CM

## 2017-04-11 NOTE — Progress Notes (Signed)
History and Physical  [facility]  [address] [place], [place] [address]  P: [phone] / F: [phone]  Lindsay Lawson  04/10/2017              36 y.o.  Chief Complaint   Patient presents with   . Results     pt here to discuss endosee/embx results.        BP 122/74   Ht 5\' 11"  (1.803 m)   Wt 170 lb (77.1 kg)   LMP 03/10/2017   BMI 23.71 kg/m   Alllergies:  Adhesive tape; Cefaclor; Nsaids; Polyurethane [urethane]; and Kiwi extract               Primary Care Physician: Asa Lente, MD    HPI : Lindsay Lawson 36 y.o. [phone] female  Pt here today for results of EMBX, results reviwed pt: no significant pathology noted Risks, benefits and alternative therapies for treatment discussed. Pt elects D&C, Hysteroscopy, endometrial ablation. ALL?s answered      Obstetric History    G4   P3   T1   P2   A1   L3     SAB1   TAB0   Ectopic0   Molar0   Multiple0   Live Births3       # Outcome Date GA Lbr Len/2nd Weight Sex Delivery Anes PTL Lv   4 Preterm 03/15/10 [redacted]w[redacted]d   F CS-Unspec EPI  LIV   3 Term 09/14/08 [redacted]w[redacted]d   M Vag-Spont EPI  LIV   2 Preterm 03/02/02 [redacted]w[redacted]d   F Vag-Spont EPI  LIV      Complications: Placenta abruptio   1 SAB                 Past Medical History:   Diagnosis Date   . Anxiety    . Arthritis    . Back pain with sciatica    . DDD (degenerative disc disease), lumbar    . Fibromyalgia    . Hashimoto's disease    . History of kidney stones    . IBS (irritable bowel syndrome)    . 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 Lupus (systemic lupus erythematosus) (HCC)    . Migraines    . PTSD (post-traumatic stress disorder)    . Scoliosis    . Secondary osteoarthritis                                                                    Past Surgical History:   Procedure Laterality Date   . CESAREAN SECTION     . KNEE SURGERY Right     nerve block---    . PATELLA SURGERY Right     patella alignment   . TUBAL LIGATION     . URETER STENT  PLACEMENT Bilateral     x6      Family History   Problem Relation Age of Onset   . Arthritis Mother         RA   . Diabetes Father    . Obesity Father    . Sleep Apnea Father    . Hypertension Father    . Elevated Lipids Father    .  Other Father         had HEP C   . Liver Cancer Father    . Other Maternal Grandmother         aspestos exposure    . Cancer Maternal Grandfather         aspestos cancer   . Kidney Disease Paternal Grandmother    . Diabetes Paternal Grandfather [id number]     Social History     Social History   . Marital status: Single     Spouse name: N/A   . Number of children: N/A   . Years of education: N/A     Occupational History   . Not on file.     Social History Main Topics   . Smoking status: Former Games developermoker   . Smokeless tobacco: Never Used      Comment: quit 2008   . Alcohol use No   . Drug use: No   . Sexual activity: Yes     Partners: Male     Other Topics Concern   . Not on file     Social History Narrative   . No narrative on file         MEDICATIONS:  Current Outpatient Prescriptions on File Prior to Visit   Medication Sig Dispense Refill   . levothyroxine (SYNTHROID) 25 MCG tablet Take 1 tablet by mouth once daily.  5   . HYDROcodone-acetaminophen (NORCO) 5-325 MG per tablet TAKE 1 TABLET BY MOUTH EVERY 6 HOURS AS NEEDED FOR PAIN for up to 7 (SEVEN) days  0   . SUMAtriptan (IMITREX) 100 MG tablet TAKE 1 TABLET AS NEEDED (max 2 (TWO) per day/12 per month)     . Methotrexate Sodium, PF, 200 MG/8ML SOLN 1ml sq injection once a week. No alcohol. Hold if on antibiotics or ill.     Marland Kitchen. EPINEPHrine (EPIPEN) 0.3 MG/0.3ML SOAJ injection Inject 0.3 mg into the muscle     . amitriptyline (ELAVIL) 10 MG tablet Take 1-2 tablets by mouth daily at bedtime.     . busPIRone (BUSPAR) 10 MG tablet TAKE 1 TABLET THREE TIMES DAILY     . citalopram (CELEXA) 40 MG tablet Take 1 tablet by mouth once daily.     . predniSONE (DELTASONE) 20 MG tablet Take 40 mg by mouth     . dicyclomine (BENTYL) 20 MG tablet TAKE 1 TABLET  BY MOUTH THREE TIMES DAILY AS NEEDED     . glycopyrrolate (ROBINUL) 2 MG tablet TAKE 1 TABLET TWICE DAILY     . tiZANidine (ZANAFLEX) 4 MG tablet TAKE 1 TABLET EVERY 8 HOURS AS NEEDED     . pregabalin (LYRICA) 225 MG capsule Take 225 mg by mouth..     . folic acid (FOLVITE) 1 MG tablet Take 1 mg by mouth     . ondansetron (ZOFRAN-ODT) 4 MG disintegrating tablet Take 4 mg by mouth     . omeprazole (PRILOSEC) 40 MG delayed release capsule TAKE 1 CAPSULE TWICE DAILY     . tretinoin (RETIN-A) 0.025 % cream Use sparingly to acne-prone areas at bedtime.       No current facility-administered medications on file prior to visit.          ALLERGIES:  Allergies as of 04/10/2017 - Review Complete 04/10/2017   Allergen Reaction Noted   . Adhesive tape Hives and Other (See Comments) 02/23/2016   . Cefaclor  10/01/2006   . Nsaids Other (See Comments) 12/24/2015   .  Polyurethane [urethane] Other (See Comments) 04/10/2017   . Kiwi extract Rash 06/01/2014           Gynecologic History:     Patient's last menstrual period was 03/10/2017.      ________________________________________________________________________  REVIEW OF SYSTEMS:       Review of Systems   Respiratory: Negative for cough, shortness of breath and wheezing.    Cardiovascular: Negative for chest pain, palpitations and leg swelling.   Gastrointestinal: Negative for abdominal pain, blood in stool, constipation and diarrhea.   Genitourinary: Negative for difficulty urinating, dysuria, flank pain and hematuria.   Skin: Negative.    Psychiatric/Behavioral: Negative.                                                                                                                                                   PHYSICAL Exam:    Constitutional:  Vitals:    04/10/17 1430   BP: 122/74   Weight: 170 lb (77.1 kg)   Height: 5\' 11"  (1.803 m)       Physical Exam   Constitutional: She is oriented to person, place, and time. She appears well-developed and well-nourished.   HENT:    Head: Normocephalic and atraumatic.   Cardiovascular: Normal rate and regular rhythm.    Abdominal: Soft. Normal appearance. There is no tenderness.   Musculoskeletal: Normal range of motion.   Neurological: She is alert and oriented to person, place, and time.   Skin: Skin is warm and intact. No lesion noted. No erythema.   Psychiatric: She has a normal mood and affect. Her behavior is normal. Judgment and thought content normal.         ASSESSMENT:      36 y.o. Annual   Diagnosis Orders   1. DUB (dysfunctional uterine bleeding)                      Counseling Completed:          PLAN:      No orders of the defined types were placed in this encounter.    No orders of the defined types were placed in this encounter.          I, Leda Quail, am scribing for, and in the presence of, Dr Conni Elliot. Electronically signed by: Leda Quail 04/11/17 7:54 AM    I, Dr Conni Elliot, personally performed the services described in this documentation, as scribed by Leda Quail in my presence, and it is both accurate and complete. Electronically signed by: Conni Elliot, MD

## 2017-04-18 ENCOUNTER — Inpatient Hospital Stay: Admit: 2017-04-18 | Payer: PRIVATE HEALTH INSURANCE | Primary: Internal Medicine

## 2017-04-18 DIAGNOSIS — Z01818 Encounter for other preprocedural examination: Secondary | ICD-10-CM

## 2017-04-18 LAB — CBC WITH AUTO DIFFERENTIAL
Basophils %: 1.1 %
Basophils Absolute: 0.1 10*3/uL (ref 0.0–0.2)
Eosinophils %: 1.4 %
Eosinophils Absolute: 0.1 10*3/uL (ref 0.0–0.7)
Hematocrit: 40.9 % (ref 37.0–47.0)
Hemoglobin: 13.9 g/dL (ref 12.0–16.0)
Lymphocytes %: 28.7 %
Lymphocytes Absolute: 2.3 10*3/uL (ref 1.0–4.8)
MCH: 31.2 pg (ref 27.0–31.3)
MCHC: 34 % (ref 33.0–37.0)
MCV: 91.6 fL (ref 82.0–100.0)
Monocytes %: 6.5 %
Monocytes Absolute: 0.5 10*3/uL (ref 0.2–0.8)
Neutrophils %: 62.3 %
Neutrophils Absolute: 5 10*3/uL (ref 1.4–6.5)
Platelets: 173 10*3/uL (ref 130–400)
RBC: 4.47 M/uL (ref 4.20–5.40)
RDW: 13.7 % (ref 11.5–14.5)
WBC: 8.1 10*3/uL (ref 4.8–10.8)

## 2017-04-18 LAB — TYPE AND SCREEN
ABO/Rh: A POS
Antibody Screen: NEGATIVE

## 2017-04-18 LAB — COMPREHENSIVE METABOLIC PANEL
ALT: 10 U/L (ref 0–33)
AST: 15 U/L (ref 0–35)
Albumin: 4.5 g/dL (ref 3.9–4.9)
Alkaline Phosphatase: 45 U/L (ref 40–130)
Anion Gap: 15 mEq/L — ABNORMAL HIGH (ref 7–13)
BUN: 4 mg/dL — ABNORMAL LOW (ref 6–20)
CO2: 23 mEq/L (ref 22–29)
Calcium: 9.1 mg/dL (ref 8.6–10.2)
Chloride: 103 mEq/L (ref 98–107)
Creatinine: 0.61 mg/dL (ref 0.50–0.90)
GFR African American: >60 (ref >60)
GFR Non-African American: >60 (ref >60)
Globulin: 2.4 g/dL (ref 2.3–3.5)
Glucose: 75 mg/dL (ref 74–109)
Potassium: 3.6 mEq/L (ref 3.5–5.1)
Sodium: 141 mEq/L (ref 132–144)
Total Bilirubin: 1.4 mg/dL — ABNORMAL HIGH (ref 0.0–1.2)
Total Protein: 6.9 g/dL (ref 6.4–8.1)

## 2017-04-18 LAB — APTT: aPTT: 28.7 s (ref 21.6–35.4)

## 2017-04-18 LAB — URINALYSIS WITH REFLEX TO CULTURE
Bilirubin Urine: NEGATIVE
Blood, Urine: NEGATIVE
Glucose, Ur: NEGATIVE mg/dL
Ketones, Urine: NEGATIVE mg/dL
Leukocyte Esterase, Urine: NEGATIVE
Nitrite, Urine: NEGATIVE
Protein, UA: NEGATIVE mg/dL
Specific Gravity, UA: 1.012 (ref 1.005–1.030)
Urobilinogen, Urine: 0.2 E.U./dL (ref <2.0)
pH, UA: 5.5 (ref 5.0–9.0)

## 2017-04-18 LAB — PROTIME-INR
INR: 1
Protime: 10.9 s (ref 9.6–12.3)

## 2017-04-18 NOTE — H&P (Signed)
Nurse Practitioner History and Physical      CHIEF COMPLAINT:  DUB & pelvic pain    HISTORY OF PRESENT ILLNESS:      The patient is a 36 y.o. female with significant past medical history of AUB & pelvic pain who presents for D & C, hysteroscopy, ablation. Sx duration flow 7-14 days with heavy flow, clots size of grapes, with pelvic & back pain & cramping.LMP 04/09/2017. G4 P3. Csection 2011 with tubal ligation to follow & sx onset. Interval cycle every 14-21 days. Scheduled for OR.    Past Medical History:        Diagnosis Date   . Anxiety    . Arthritis    . Back pain with sciatica    . DDD (degenerative disc disease), lumbar    . Fibromyalgia    . Hashimoto's disease     meds > 2 months   . History of kidney stones    . IBS (irritable bowel syndrome)    . 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 Lupus (systemic lupus erythematosus) (HCC)    . MDRO (multiple drug resistant organisms) resistance 2010    camping /bite by spider rightbuttock crease / I & D / dx MRSA   . Migraines    . PTSD (post-traumatic stress disorder)    . Scoliosis    . Secondary osteoarthritis      Past Surgical History:    Past Surgical History:   Procedure Laterality Date   . CESAREAN SECTION  2011   . CHOLECYSTECTOMY     . COLONOSCOPY     . ENDOSCOPY, COLON, DIAGNOSTIC     . KNEE SURGERY Right     nerve block---    . PATELLA SURGERY Right     patella alignment   . TUBAL LIGATION  2011    following csection   . URETER STENT PLACEMENT Bilateral     x6          Medications Prior to Admission:    Current Outpatient Prescriptions   Medication Sig Dispense Refill   . ALPRAZolam (XANAX) 0.5 MG tablet TAKE 1 TABLET BY MOUTH THREE TIMES DAILY AS NEEDED     . levothyroxine (SYNTHROID) 25 MCG tablet Take 1 tablet by mouth once daily.  5   . HYDROcodone-acetaminophen (NORCO) 5-325 MG per tablet TAKE 1 TABLET BY MOUTH EVERY 6 HOURS AS NEEDED FOR PAIN for up to 7 (SEVEN) days  0   . SUMAtriptan (IMITREX) 100 MG tablet TAKE 1 TABLET AS  NEEDED (max 2 (TWO) per day/12 per month)     . Methotrexate Sodium, PF, 200 MG/8ML SOLN 1ml sq injection once a week. No alcohol. Hold if on antibiotics or ill.     Marland Kitchen. EPINEPHrine (EPIPEN) 0.3 MG/0.3ML SOAJ injection Inject 0.3 mg into the muscle     . amitriptyline (ELAVIL) 10 MG tablet Take 1-2 tablets by mouth daily at bedtime.     . busPIRone (BUSPAR) 10 MG tablet TAKE 1 TABLET THREE TIMES DAILY     . citalopram (CELEXA) 40 MG tablet Take 1 tablet by mouth once daily.     . predniSONE (DELTASONE) 20 MG tablet Take 40 mg by mouth     . dicyclomine (BENTYL) 20 MG tablet TAKE 1 TABLET BY MOUTH THREE TIMES DAILY AS NEEDED     . glycopyrrolate (ROBINUL) 2 MG tablet TAKE 1 TABLET TWICE DAILY     . tiZANidine (ZANAFLEX) 4 MG  tablet TAKE 1 TABLET EVERY 8 HOURS AS NEEDED     . pregabalin (LYRICA) 225 MG capsule Take 225 mg by mouth 2 times daily. .     . folic acid (FOLVITE) 1 MG tablet Take 1 mg by mouth daily      . ondansetron (ZOFRAN-ODT) 4 MG disintegrating tablet Take 4 mg by mouth every 8 hours as needed      . omeprazole (PRILOSEC) 40 MG delayed release capsule TAKE 1 CAPSULE TWICE DAILY     . tretinoin (RETIN-A) 0.025 % cream Use sparingly to acne-prone areas at bedtime.       No current facility-administered medications for this encounter.        Allergies:  Adhesive tape; Cefaclor; Nsaids; Other; Polyurethane [urethane]; Soap; and Kiwi extract    Social History:   Social History     Social History   . Marital status: Married     Spouse name: N/A   . Number of children: N/A   . Years of education: N/A     Occupational History   . Not on file.     Social History Main Topics   . Smoking status: Former Smoker     Packs/day: 0.50     Years: 5.00     Types: Cigarettes     Quit date: 04/18/2006   . Smokeless tobacco: Never Used      Comment: quit 2008   . Alcohol use No   . Drug use: Yes     Types: Marijuana      Comment: occas chews a gummie cannibus   . Sexual activity: Yes     Partners: Male     Other Topics  Concern   . Not on file     Social History Narrative   . No narrative on file       Family History:   Family History   Problem Relation Age of Onset   . Arthritis Mother         RA   . Other Mother         Hep C   . Diabetes Father    . Obesity Father    . Sleep Apnea Father    . Hypertension Father    . Elevated Lipids Father    . Other Father         had HEP C   . Liver Cancer Father    . Other Maternal Grandmother         aspestos exposure    . Cancer Maternal Grandfather         aspestos cancer   . Kidney Disease Paternal Grandmother    . Diabetes Paternal Grandfather 10   . No Known Problems Brother    . Seizures Daughter    . Depression Daughter    . Anxiety Disorder Daughter    . Anxiety Disorder Son    . Seizures Son        Review of Systems   Constitutional: Negative.  Negative for chills and fever.   HENT: Negative for congestion and sore throat.    Eyes: Negative.  Negative for blurred vision and double vision.   Respiratory: Negative for cough, shortness of breath, wheezing and stridor.    Cardiovascular: Negative for chest pain, palpitations and leg swelling.   Gastrointestinal: Positive for constipation and diarrhea. Negative for heartburn and nausea.        Hx IBS   Genitourinary: Positive for dysuria and frequency.  Musculoskeletal: Positive for back pain, myalgias and neck pain.   Skin: Negative.    Neurological: Positive for headaches (migraine headaches).   Endo/Heme/Allergies: Negative.    Psychiatric/Behavioral: Positive for depression.       Vitals:  BP 107/67   Pulse 59   Temp 98 F (36.7 C) (Temporal)   Resp 16   Ht 5' 10.5" (1.791 m)   Wt 167 lb 8 oz (76 kg)   LMP 04/09/2017   SpO2 98%   BMI 23.69 kg/m     Physical Exam   Constitutional: She is oriented to person, place, and time and well-developed, well-nourished, and in no distress.   HENT:   Head: Normocephalic and atraumatic.   Right Ear: External ear normal.   Left Ear: External ear normal.   Nose: Nose normal.    Mouth/Throat: Oropharynx is clear and moist. No oropharyngeal exudate.   TMJ clicking   Eyes: Conjunctivae and EOM are normal. Pupils are equal, round, and reactive to light. No scleral icterus.   Neck: Normal range of motion. Neck supple.   Cardiovascular: Normal rate, regular rhythm and normal heart sounds.  Exam reveals no gallop and no friction rub.    No murmur heard.  Pulmonary/Chest: Effort normal and breath sounds normal. She has no wheezes. She has no rales.   Abdominal: Soft. Bowel sounds are normal. She exhibits no mass. There is no tenderness.   Transverse pelvic scar   Genitourinary:   Genitourinary Comments: Deferred.   Musculoskeletal: Normal range of motion. She exhibits no edema.   Ambulates with a cane.   Neurological: She is alert and oriented to person, place, and time.   Skin: Skin is warm and dry.   Psychiatric: Mood, memory, affect and judgment normal.   Vitals reviewed.      Assessment:  Patient Active Problem List   Diagnosis   . DUB (dysfunctional uterine bleeding)   . Pelvic pain in female         Plan:  Scheduled for D & C, hysteroscopy, ablation    KARINNA BEADLES, APRN - CNP  04/18/2017  1:56 PM

## 2017-04-18 NOTE — Telephone Encounter (Signed)
Order given to dr Marcelline Mateskovach for revision

## 2017-04-18 NOTE — Telephone Encounter (Signed)
Pt is allergic to vancomyacin and cefaclor . Need a script for a new antibiotic that she can take for surgery day.

## 2017-04-25 ENCOUNTER — Inpatient Hospital Stay: Payer: PRIVATE HEALTH INSURANCE

## 2017-04-25 MED ORDER — LIDOCAINE HCL 2 % IJ SOLN
2 % | INTRAMUSCULAR | Status: DC | PRN
Start: 2017-04-25 — End: 2017-04-25
  Administered 2017-04-25: 12:00:00 100 via INTRAVENOUS

## 2017-04-25 MED ORDER — DIPHENHYDRAMINE HCL 50 MG/ML IJ SOLN
50 MG/ML | Freq: Four times a day (QID) | INTRAMUSCULAR | Status: DC | PRN
Start: 2017-04-25 — End: 2017-04-25
  Administered 2017-04-25: 11:00:00 25 mg via INTRAVENOUS

## 2017-04-25 MED ORDER — DIPHENHYDRAMINE HCL 50 MG/ML IJ SOLN
50 MG/ML | Freq: Once | INTRAMUSCULAR | Status: DC | PRN
Start: 2017-04-25 — End: 2017-04-25

## 2017-04-25 MED ORDER — NORMAL SALINE FLUSH 0.9 % IV SOLN
0.9 % | INTRAVENOUS | Status: DC | PRN
Start: 2017-04-25 — End: 2017-04-25

## 2017-04-25 MED ORDER — BLISTEX MEDICATED EX OINT
CUTANEOUS | Status: DC
Start: 2017-04-25 — End: 2017-04-25

## 2017-04-25 MED ORDER — LACTATED RINGERS IV SOLN
INTRAVENOUS | Status: DC
Start: 2017-04-25 — End: 2017-04-25

## 2017-04-25 MED ORDER — GENTAMICIN SULFATE 40 MG/ML IJ SOLN
40 | INTRAMUSCULAR | Status: AC
Start: 2017-04-25 — End: 2017-04-25

## 2017-04-25 MED ORDER — DEXAMETHASONE SODIUM PHOSPHATE 10 MG/ML IJ SOLN
10 MG/ML | INTRAMUSCULAR | Status: DC | PRN
Start: 2017-04-25 — End: 2017-04-25
  Administered 2017-04-25: 12:00:00 10 via INTRAVENOUS

## 2017-04-25 MED ORDER — LACTATED RINGERS IV SOLN
INTRAVENOUS | Status: AC
Start: 2017-04-25 — End: 2017-04-25

## 2017-04-25 MED ORDER — SILVER NITRATE-POT NITRATE 75-25 % EX MISC
75-25 | CUTANEOUS | Status: AC
Start: 2017-04-25 — End: 2017-04-25

## 2017-04-25 MED ORDER — DEXAMETHASONE SODIUM PHOSPHATE 10 MG/ML IJ SOLN
10 | INTRAMUSCULAR | Status: AC
Start: 2017-04-25 — End: 2017-04-25

## 2017-04-25 MED ORDER — MEPERIDINE HCL 25 MG/ML IJ SOLN
25 MG/ML | INTRAMUSCULAR | Status: DC | PRN
Start: 2017-04-25 — End: 2017-04-25

## 2017-04-25 MED ORDER — LACTATED RINGERS IV SOLN
INTRAVENOUS | Status: DC
Start: 2017-04-25 — End: 2017-04-25
  Administered 2017-04-25: 11:00:00 125 via INTRAVENOUS

## 2017-04-25 MED ORDER — NORMAL SALINE FLUSH 0.9 % IV SOLN
0.9 % | Freq: Two times a day (BID) | INTRAVENOUS | Status: DC
Start: 2017-04-25 — End: 2017-04-25

## 2017-04-25 MED ORDER — HYDROCODONE-ACETAMINOPHEN 5-325 MG PO TABS
5-325 MG | ORAL | Status: DC | PRN
Start: 2017-04-25 — End: 2017-04-25

## 2017-04-25 MED ORDER — HYDROCORTISONE NA SUCCINATE PF 100 MG IJ SOLR
100 MG | Freq: Three times a day (TID) | INTRAMUSCULAR | Status: DC
Start: 2017-04-25 — End: 2017-04-25
  Administered 2017-04-25: 11:00:00 100 mg via INTRAVENOUS

## 2017-04-25 MED ORDER — FENTANYL CITRATE (PF) 100 MCG/2ML IJ SOLN
100 MCG/2ML | INTRAMUSCULAR | Status: DC | PRN
Start: 2017-04-25 — End: 2017-04-25
  Administered 2017-04-25 (×2): 50 ug via INTRAVENOUS

## 2017-04-25 MED ORDER — FENTANYL CITRATE (PF) 100 MCG/2ML IJ SOLN
100 | INTRAMUSCULAR | Status: AC
Start: 2017-04-25 — End: 2017-04-25

## 2017-04-25 MED ORDER — CLINDAMYCIN PHOSPHATE 600 MG/4ML IJ SOLN
600 | INTRAMUSCULAR | Status: AC
Start: 2017-04-25 — End: 2017-04-25

## 2017-04-25 MED ORDER — METOCLOPRAMIDE HCL 5 MG/ML IJ SOLN
5 MG/ML | Freq: Once | INTRAMUSCULAR | Status: DC | PRN
Start: 2017-04-25 — End: 2017-04-25

## 2017-04-25 MED ORDER — LACTATED RINGERS IV SOLN
INTRAVENOUS | Status: DC
Start: 2017-04-25 — End: 2017-04-25
  Administered 2017-04-25: 12:00:00 via INTRAVENOUS

## 2017-04-25 MED ORDER — LIDOCAINE HCL (PF) 2 % IJ SOLN
2 | INTRAMUSCULAR | Status: AC
Start: 2017-04-25 — End: 2017-04-25

## 2017-04-25 MED ORDER — LIDOCAINE HCL (PF) 1 % IJ SOLN
1 % | Freq: Once | INTRAMUSCULAR | Status: DC | PRN
Start: 2017-04-25 — End: 2017-04-25

## 2017-04-25 MED ORDER — ONDANSETRON HCL 4 MG/2ML IJ SOLN
4 MG/2ML | Freq: Once | INTRAMUSCULAR | Status: DC | PRN
Start: 2017-04-25 — End: 2017-04-25

## 2017-04-25 MED ORDER — SODIUM CHLORIDE 0.9 % IV SOLN
0.9 | INTRAVENOUS | Status: AC
Start: 2017-04-25 — End: 2017-04-25

## 2017-04-25 MED ORDER — CLINDAMYCIN PHOSPHATE 600 MG/4ML IJ SOLN
600 MG/4ML | INTRAMUSCULAR | Status: DC | PRN
Start: 2017-04-25 — End: 2017-04-25
  Administered 2017-04-25: 12:00:00 600 via INTRAVENOUS

## 2017-04-25 MED ORDER — SODIUM CHLORIDE 0.9 % IV SOLN
0.9 % | INTRAVENOUS | Status: DC
Start: 2017-04-25 — End: 2017-04-25

## 2017-04-25 MED ORDER — ONDANSETRON HCL 4 MG/2ML IJ SOLN
4 MG/2ML | Freq: Four times a day (QID) | INTRAMUSCULAR | Status: DC | PRN
Start: 2017-04-25 — End: 2017-04-25

## 2017-04-25 MED ORDER — SODIUM CHLORIDE 0.9 % IR SOLN
0.9 % | Status: DC | PRN
Start: 2017-04-25 — End: 2017-04-25
  Administered 2017-04-25: 12:00:00 1000
  Administered 2017-04-25: 12:00:00 3000

## 2017-04-25 MED ORDER — FENTANYL CITRATE (PF) 100 MCG/2ML IJ SOLN
100 MCG/2ML | INTRAMUSCULAR | Status: DC | PRN
Start: 2017-04-25 — End: 2017-04-25
  Administered 2017-04-25 (×2): 50 via INTRAVENOUS

## 2017-04-25 MED ORDER — ONDANSETRON HCL 4 MG/2ML IJ SOLN
4 | INTRAMUSCULAR | Status: AC
Start: 2017-04-25 — End: 2017-04-25

## 2017-04-25 MED ORDER — HYDROMORPHONE 0.5MG/0.5ML IJ SOLN
1 MG/ML | Status: DC | PRN
Start: 2017-04-25 — End: 2017-04-25
  Administered 2017-04-25 (×2): 0.5 mg via INTRAVENOUS

## 2017-04-25 MED ORDER — GENTAMICIN SULFATE 40 MG/ML IJ SOLN
40 MG/ML | INTRAMUSCULAR | Status: DC | PRN
Start: 2017-04-25 — End: 2017-04-25
  Administered 2017-04-25: 12:00:00 80 via INTRAVENOUS

## 2017-04-25 MED ORDER — MIDAZOLAM HCL 2 MG/2ML IJ SOLN
2 | INTRAMUSCULAR | Status: AC
Start: 2017-04-25 — End: 2017-04-25

## 2017-04-25 MED ORDER — PROPOFOL 200 MG/20ML IV EMUL
200 | INTRAVENOUS | Status: AC
Start: 2017-04-25 — End: 2017-04-25

## 2017-04-25 MED ORDER — MIDAZOLAM HCL 2 MG/2ML IJ SOLN
2 MG/ML | INTRAMUSCULAR | Status: DC | PRN
Start: 2017-04-25 — End: 2017-04-25
  Administered 2017-04-25: 12:00:00 2 via INTRAVENOUS

## 2017-04-25 MED ORDER — HYDROCODONE-ACETAMINOPHEN 5-325 MG PO TABS
5-325 MG | ORAL | Status: DC | PRN
Start: 2017-04-25 — End: 2017-04-25
  Administered 2017-04-25: 15:00:00 1 via ORAL

## 2017-04-25 MED ORDER — PROPOFOL 200 MG/20ML IV EMUL
200 MG/20ML | INTRAVENOUS | Status: DC | PRN
Start: 2017-04-25 — End: 2017-04-25
  Administered 2017-04-25: 12:00:00 150 via INTRAVENOUS

## 2017-04-25 MED ORDER — ONDANSETRON HCL 4 MG/2ML IJ SOLN
4 MG/2ML | INTRAMUSCULAR | Status: DC | PRN
Start: 2017-04-25 — End: 2017-04-25
  Administered 2017-04-25: 13:00:00 4 via INTRAVENOUS

## 2017-04-25 MED FILL — SODIUM CHLORIDE 0.9 % IV SOLN: 0.9 % | INTRAVENOUS | Qty: 500

## 2017-04-25 MED FILL — MONOJECT FLUSH SYRINGE 0.9 % IV SOLN: 0.9 % | INTRAVENOUS | Qty: 10

## 2017-04-25 MED FILL — HYDROCODONE-ACETAMINOPHEN 5-325 MG PO TABS: 5-325 MG | ORAL | Qty: 1

## 2017-04-25 MED FILL — SOLU-CORTEF 100 MG IJ SOLR: 100 MG | INTRAMUSCULAR | Qty: 2

## 2017-04-25 MED FILL — DIPHENHYDRAMINE HCL 50 MG/ML IJ SOLN: 50 MG/ML | INTRAMUSCULAR | Qty: 1

## 2017-04-25 MED FILL — CLINDAMYCIN PHOSPHATE 600 MG/4ML IJ SOLN: 600 MG/4ML | INTRAMUSCULAR | Qty: 4

## 2017-04-25 MED FILL — LACTATED RINGERS IV SOLN: INTRAVENOUS | Qty: 1000

## 2017-04-25 MED FILL — SILVER NITRATE-POT NITRATE 75-25 % EX MISC: 75-25 % | CUTANEOUS | Qty: 2

## 2017-04-25 MED FILL — SODIUM CHLORIDE 0.9 % IV SOLN: 0.9 % | INTRAVENOUS | Qty: 200

## 2017-04-25 MED FILL — GENTAMICIN SULFATE 40 MG/ML IJ SOLN: 40 MG/ML | INTRAMUSCULAR | Qty: 2

## 2017-04-25 MED FILL — LIDOCAINE HCL (PF) 2 % IJ SOLN: 2 % | INTRAMUSCULAR | Qty: 5

## 2017-04-25 MED FILL — MIDAZOLAM HCL 2 MG/2ML IJ SOLN: 2 MG/ML | INTRAMUSCULAR | Qty: 2

## 2017-04-25 MED FILL — DIPRIVAN 200 MG/20ML IV EMUL: 200 MG/20ML | INTRAVENOUS | Qty: 20

## 2017-04-25 MED FILL — BLISTEX MEDICATED EX OINT: CUTANEOUS | Qty: 6.3

## 2017-04-25 MED FILL — DEXAMETHASONE SODIUM PHOSPHATE 10 MG/ML IJ SOLN: 10 MG/ML | INTRAMUSCULAR | Qty: 1

## 2017-04-25 MED FILL — FENTANYL CITRATE (PF) 100 MCG/2ML IJ SOLN: 100 MCG/2ML | INTRAMUSCULAR | Qty: 2

## 2017-04-25 MED FILL — HYDROMORPHONE 0.5MG/0.5ML IJ SOLN: 1 MG/ML | Qty: 0.5

## 2017-04-25 MED FILL — ONDANSETRON HCL 4 MG/2ML IJ SOLN: 4 MG/2ML | INTRAMUSCULAR | Qty: 2

## 2017-04-25 NOTE — Op Note (Signed)
[facility] [facility]                       [address] [place], [place] [id number]                                 OPERATIVE REPORT    PATIENT NAME: Lindsay Lawson, Lindsay Lawson                     DOB:        29-Nov-1980  MED REC NO:   [id number]                            ROOM:  ACCOUNT NO:   [id number]                           ADMIT DATE: 04/25/2017  PROVIDER:     Carolin Sicksorie L Christion Leonhard, MD    DATE OF PROCEDURE:  04/25/2017    PREOPERATIVE DIAGNOSES:  1.  Dysfunctional uterine bleeding.  2.  Desires no future fertility.    POSTOPERATIVE DIAGNOSES:  1.  Dysfunctional uterine bleeding.  2.  Desires no future fertility.    PROCEDURE:  D and C, hysteroscopy, and ablation via NovaSure.    SURGEON:  Carolin Sicksorie L Ronasia Isola, MD    ANESTHESIA:  LMA per Delaney Meigsamara, CRNA    ESTIMATED BLOOD LOSS:  Minimal.    FLUIDS:  500 mL LR.    URINE OUTPUT:  25 mL on straight catheterization.    DRAINS:  None.    PACKS:  None.    SPECIMENS:  1.  EMB.  2.  ECC.    FINDINGS:  Uterus sounds to 9 cm.  Uterine cavity length is 5.5 cm, uterine  cavity width is 4.6 cm.  Time of ablation is 2 minutes.  Power of ablation  is 139 watts.  Fluid deficit is 30 mL.  Bilateral tubal ostia are  visualized.  There are no suspicious lesions and post ablation hysteroscopy  was adequate.  Ablation of the entire uterine cavity.    DESCRIPTION OF PROCEDURE:  The patient was informed of all risks, benefits,  alternatives to the procedure to include but not limited to bleeding,  transfusion, infection, anesthesia, death, injury to bowel, bladder or  ureters and perforation of the uterus.  She voiced understanding and  desires to proceed.    The patient was taken to the operating room awake, alert, oriented x3 and  placed in the dorsal supine position on the operating table and then  general anesthesia is administered. The patient was then placed in the  dorsal lithotomy position in [place] stirrups, prepped and draped in the  usual sterile fashion.  A sterile speculum was  placed in the vagina,  anterior lip of the cervix was grasped with a single tooth tenaculum and  the uterus sounds to 7 cm.  The uterus is sounded and then the ECC is  obtained.  The cervix is serially dilated until the 7 mm inflow/outflow  hysteroscope can be easily admitted atraumatically.  The above findings are  noted.  Attention was then turned to the NovaSure ablation technique.  This  was performed in usual standard procedure with the above aforementioned  measurement and provisions and per protocol after assessing for cavity  integrity and perforation.  After completion  of the ablation the cavity was  again inspected and noted to be adequately ablated upon hysteroscopic  evaluation post ablation.  All instruments were then removed from the  uterus and vagina and silver nitrate was then placed on anterior lip of the  cervix for hemostasis.  The patient was awoken from anesthesia and taken to  the PACU in stable condition.  All needle and instrument counts were  correct x3.        Carolin SicksORIE L Dejon Jungman, MD    D: 04/25/2017 9:18:36       T: 04/25/2017 13:48:54     [email or web address]  Job#: [id number]     Doc#: [id number]    CC:  Carolin Sicksorie L Shatana Saxton, MD       Georgena SpurlingVictor J Trzeciak, MD

## 2017-04-25 NOTE — Progress Notes (Signed)
Taking ice chips well.  Pain is better controlled.   Will progress to Phase II.  Condition stable.

## 2017-04-25 NOTE — Progress Notes (Signed)
To room 17 accompanied by Fannie KneeSue, RN.  Head of bed elevated for comfort. Abdomen is soft, clean peri pad. Pain 5/10. Mother at bedside. Taking drink well.

## 2017-04-25 NOTE — Anesthesia Post-Procedure Evaluation (Signed)
Department of Anesthesiology  Postprocedure Note    Patient: Lindsay RichmondCarol Lawson  MRN: [id number]  Birthdate: [birth year]  Date of evaluation: 04/25/2017  Time:  8:55 AM     Procedure Summary     Date:  04/25/17 Room / Location:  [facility] OR 03 / [facility]    Anesthesia Start:  0750 Anesthesia Stop:      Procedure:  D&C  HYSTEROSCOPY ABLATION (N/A ) Diagnosis:  (DUB)    Surgeon:  Carolin Sicksorie L Kovach, MD Responsible Provider:  Jennings BooksMichael Joseph Choban, MD    Anesthesia Type:  general ASA Status:  3          Anesthesia Type: general    Aldrete Phase I:      Aldrete Phase II:      Last vitals: Reviewed and per EMR flowsheets.       Anesthesia Post Evaluation    Patient location during evaluation: PACU  Patient participation: complete - patient participated  Level of consciousness: awake and alert  Pain score: 0  Airway patency: patent  Nausea & Vomiting: no nausea and no vomiting  Complications: no  Cardiovascular status: blood pressure returned to baseline and hemodynamically stable  Respiratory status: acceptable  Hydration status: euvolemic

## 2017-04-25 NOTE — Brief Op Note (Signed)
Brief Postoperative Note  ______________________________________________________________    Patient: Lindsay Lawson  Date of Birth: 07-24-1981  MRN: [id number]  Date of Procedure: 04/25/2017    Pre-Op Diagnosis: DUB    Post-Op Diagnosis: Same       Procedure(s):  D&C  HYSTEROSCOPY ABLATION, LATEX FREE ITEMS    Anesthesia: General    Surgeon(s):  Carolin Sicksorie L Maripaz Mullan, MD    Staff:  Scrub Person First: Rulon SeraKatie Kallas     Estimated Blood Loss: * No values recorded between 04/25/2017  7:50 AM and 04/25/2017  8:06 AM * mL    Complications: None    Specimens:   * No specimens in log *    Implants:  * No implants in log *      Drains:      Findings: PLS SEE DICT OP NOTE    Carolin SicksORIE L Emanuela Runnion, MD  Date: 04/25/2017  Time: 8:06 AM

## 2017-04-25 NOTE — H&P (Signed)
[facility]  History and Physical Update    Pt Name: Lindsay RichmondCarol Stucker  MRN: [id number]  Birthdate: 28-Sep-1981  Date of evaluation: 04/25/2017    [x]  I have examined the patient and reviewed the H&P/Consult and there are no changes to the patient or plans.    []  I have examined the patient and reviewed the H&P/Consult and have noted the following changes:            Tarry Fountain L Karsten Howry,MD  Electronically signed 04/25/2017 at 8:04 AM

## 2017-04-25 NOTE — Anesthesia Pre-Procedure Evaluation (Signed)
Department of Anesthesiology  Preprocedure Note       Name:  Lindsay Lawson   Age:  36 y.o.  DOB:  23-Dec-1980                                          MRN:  [id number]         Date:  04/25/2017      Surgeon: Moishe Spice):  Corie Lawernce Pitts, MD    Procedure: Procedure(s):  D&C  HYSTEROSCOPY ABLATION, LATEX FREE ITEMS    Medications prior to admission:   Prior to Admission medications    Medication Sig Start Date End Date Taking? Authorizing Provider   levothyroxine (SYNTHROID) 25 MCG tablet Take 1 tablet by mouth once daily. 03/02/17  Yes Historical Provider, MD   HYDROcodone-acetaminophen (NORCO) 5-325 MG per tablet TAKE 1 TABLET BY MOUTH EVERY 6 HOURS AS NEEDED FOR PAIN for up to 7 (SEVEN) days 03/01/17  Yes Historical Provider, MD   SUMAtriptan (IMITREX) 100 MG tablet TAKE 1 TABLET AS NEEDED (max 2 (TWO) per day/12 per month) 06/12/16  Yes Historical Provider, MD   Methotrexate Sodium, PF, 200 MG/8ML SOLN 1ml sq injection once a week. No alcohol. Hold if on antibiotics or ill. 06/27/16  Yes Historical Provider, MD   amitriptyline (ELAVIL) 10 MG tablet Take 1-2 tablets by mouth daily at bedtime. 10/26/16  Yes Historical Provider, MD   busPIRone (BUSPAR) 10 MG tablet TAKE 1 TABLET THREE TIMES DAILY 01/08/17  Yes Historical Provider, MD   citalopram (CELEXA) 40 MG tablet Take 1 tablet by mouth once daily. 10/26/16  Yes Historical Provider, MD   predniSONE (DELTASONE) 20 MG tablet Take 40 mg by mouth as needed  12/01/16  Yes Historical Provider, MD   dicyclomine (BENTYL) 20 MG tablet TAKE 1 TABLET BY MOUTH THREE TIMES DAILY AS NEEDED 01/08/17  Yes Historical Provider, MD   glycopyrrolate (ROBINUL) 2 MG tablet TAKE 1 TABLET TWICE DAILY 01/08/17  Yes Historical Provider, MD   tiZANidine (ZANAFLEX) 4 MG tablet TAKE 1 TABLET EVERY 8 HOURS AS NEEDED 01/08/17  Yes Historical Provider, MD   pregabalin (LYRICA) 225 MG capsule Take 225 mg by mouth 2 times daily. . 12/04/16  Yes Historical Provider, MD   ondansetron (ZOFRAN-ODT) 4 MG disintegrating  tablet Take 4 mg by mouth every 8 hours as needed  04/27/16  Yes Historical Provider, MD   omeprazole (PRILOSEC) 40 MG delayed release capsule TAKE 1 CAPSULE TWICE DAILY 06/12/16  Yes Historical Provider, MD   tretinoin (RETIN-A) 0.025 % cream Use sparingly to acne-prone areas at bedtime. 12/30/15  Yes Historical Provider, MD   EPINEPHrine (EPIPEN) 0.3 MG/0.3ML SOAJ injection Inject 0.3 mg into the muscle 12/18/16   Historical Provider, MD   folic acid (FOLVITE) 1 MG tablet Take 1 mg by mouth daily  04/26/16   Historical Provider, MD       Current medications:    Current Facility-Administered Medications   Medication Dose Route Frequency Provider Last Rate Last Dose   . lactated ringers infusion   Intravenous Continuous Clarene Reamer, APRN - CNP       . lactated ringers infusion   Intravenous Continuous TIMMI DEVORA, APRN - CNP 125 mL/hr at 04/25/17 0645 125 mL/hr at 04/25/17 0645   . lidocaine PF 1 % injection 1 mL  1 mL Intradermal Once PRN Clarene Reamer, APRN -  CNP       . sodium chloride flush 0.9 % injection 10 mL  10 mL Intravenous 2 times per day Clarene ReamerPatricia K Tolleson, APRN - CNP       . sodium chloride flush 0.9 % injection 10 mL  10 mL Intravenous PRN Clarene ReamerPatricia K Mesler, APRN - CNP       . lactated ringers infusion            . fentaNYL (SUBLIMAZE) injection 50 mcg  50 mcg Intravenous Q10 Min PRN Jennings BooksMichael Joseph Emmalena Canny, MD       . HYDROmorphone (DILAUDID) injection 0.5 mg  0.5 mg Intravenous Q10 Min PRN Jennings BooksMichael Joseph Kelani Robart, MD       . HYDROcodone-acetaminophen [facility](NORCO) 5-325 MG per tablet 1 tablet  1 tablet Oral PRN Jennings BooksMichael Joseph Davis Ambrosini, MD        Or   . HYDROcodone-acetaminophen (NORCO) 5-325 MG per tablet 2 tablet  2 tablet Oral PRN Jennings BooksMichael Joseph Zarius Furr, MD       . diphenhydrAMINE (BENADRYL) injection 12.5 mg  12.5 mg Intravenous Once PRN Jennings BooksMichael Joseph Wahid Holley, MD       . ondansetron [facility](ZOFRAN) injection 4 mg  4 mg Intravenous Once PRN Jennings BooksMichael Joseph Obaloluwa Delatte, MD       . metoclopramide [facility](REGLAN)  injection 10 mg  10 mg Intravenous Once PRN Jennings BooksMichael Joseph Pegeen Stiger, MD       . meperidine (DEMEROL) injection 12.5 mg  12.5 mg Intravenous Q5 Min PRN Jennings BooksMichael Joseph Cristhian Vanhook, MD       . lactated ringers infusion   Intravenous Continuous Jennings BooksMichael Joseph Shakeyla Giebler, MD       . sodium chloride flush 0.9 % injection 10 mL  10 mL Intravenous 2 times per day Jennings BooksMichael Joseph Tinleigh Whitmire, MD       . sodium chloride flush 0.9 % injection 10 mL  10 mL Intravenous PRN Jennings BooksMichael Joseph Marzelle Rutten, MD       . lidocaine PF 1 % injection 1 mL  1 mL Intradermal Once PRN Jennings BooksMichael Joseph Madalin Hughart, MD           Allergies:    Allergies   Allergen Reactions   . Adhesive Tape Hives and Other (See Comments)     Skin irriation/ POLYURTHEANE 39   . Cefaclor Hives   . Nsaids Other (See Comments)     Patient cannot take Nsaids. Has IBS   . Other      LATEX FREE cause skin burns   . Polyurethane [Urethane] Other (See Comments)     Literally it leaves burn marks   . Soap      [employer] [place] soaps causes skin irritation   . Kiwi Extract Rash       Problem List:    Patient Active Problem List   Diagnosis Code   . DUB (dysfunctional uterine bleeding) N93.8   . Pelvic pain in female R10.2       Past Medical History:        Diagnosis Date   . Anxiety    . Arthritis    . Back pain with sciatica    . DDD (degenerative disc disease), lumbar    . Fibromyalgia    . Hashimoto's disease     meds > 2 months   . History of kidney stones    . IBS (irritable bowel syndrome)    . 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 Lupus (systemic lupus erythematosus) (HCC)    .  MDRO (multiple drug resistant organisms) resistance 2010    camping /bite by spider rightbuttock crease / I & D / dx MRSA   . Migraines    . PTSD (post-traumatic stress disorder)    . Scoliosis    . Secondary osteoarthritis        Past Surgical History:        Procedure Laterality Date   . CESAREAN SECTION  2011   . CHOLECYSTECTOMY     . COLONOSCOPY     . ENDOSCOPY, COLON, DIAGNOSTIC     . KNEE SURGERY  Right     nerve block---    . PATELLA SURGERY Right     patella alignment   . TUBAL LIGATION  2011    following csection   . URETER STENT PLACEMENT Bilateral     x6        Social History:    Social History   Substance Use Topics   . Smoking status: Former Smoker     Packs/day: 0.50     Years: 5.00     Types: Cigarettes     Quit date: 04/18/2006   . Smokeless tobacco: Never Used      Comment: quit 2008   . Alcohol use No                                Counseling given: Not Answered      Vital Signs (Current):   Vitals:    04/25/17 0620   BP: 100/69   Pulse: 63   Temp: 98.6 F (37 C)   TempSrc: Temporal   SpO2: 96%                                              BP Readings from Last 3 Encounters:   04/25/17 100/69   04/18/17 107/67   04/10/17 122/74       NPO Status: Time of last liquid consumption: 2330                        Time of last solid consumption: 1900                        Date of last liquid consumption: 04/24/17                        Date of last solid food consumption: 04/24/17    BMI:   Wt Readings from Last 3 Encounters:   04/18/17 167 lb 8 oz (76 kg)   04/10/17 170 lb (77.1 kg)   03/21/17 169 lb (76.7 kg)     There is no height or weight on file to calculate BMI.    CBC:   Lab Results   Component Value Date    WBC 8.1 04/18/2017    RBC 4.47 04/18/2017    HGB 13.9 04/18/2017    HCT 40.9 04/18/2017    MCV 91.6 04/18/2017    RDW 13.7 04/18/2017    PLT 173 04/18/2017       CMP:   Lab Results   Component Value Date    NA 141 04/18/2017    K 3.6 04/18/2017    CL 103 04/18/2017    CO2 23 04/18/2017  BUN 4 04/18/2017    CREATININE 0.61 04/18/2017    GFRAA >60.0 04/18/2017    LABGLOM >60.0 04/18/2017    GLUCOSE 75 04/18/2017    PROT 6.9 04/18/2017    CALCIUM 9.1 04/18/2017    BILITOT 1.4 04/18/2017    ALKPHOS 45 04/18/2017    AST 15 04/18/2017    ALT 10 04/18/2017       POC Tests: No results for input(s): POCGLU, POCNA, POCK, POCCL, POCBUN, POCHEMO, POCHCT in the last 72 hours.    Coags:   Lab Results    Component Value Date    PROTIME 10.9 04/18/2017    INR 1.0 04/18/2017    APTT 28.7 04/18/2017       HCG (If Applicable):   Lab Results   Component Value Date    PREGTESTUR NEGATIVE 03/21/2017        ABGs: No results found for: PHART, PO2ART, PCO2ART, HCO3ART, BEART, O2SATART     Type & Screen (If Applicable):  No results found for: LABABO, LABRH    Anesthesia Evaluation  Patient summary reviewed and Nursing notes reviewed no history of anesthetic complications:   Airway: Mallampati: II  TM distance: >3 FB   Neck ROM: full  Mouth opening: > = 3 FB Dental: normal exam         Pulmonary:Negative Pulmonary ROS and normal exam                               Cardiovascular:Negative CV ROS                      Neuro/Psych:   (+) neuromuscular disease:, psychiatric history: stable with treatment            GI/Hepatic/Renal: Neg GI/Hepatic/Renal ROS            Endo/Other:    (+) hyperthyroidism: arthritis:., .          Pt had PAT visit.       Abdominal:   (+) obese,         Vascular: negative vascular ROS.                                       Anesthesia Plan      general     ASA 3       Induction: intravenous.    MIPS: Prophylactic antiemetics administered.  Anesthetic plan and risks discussed with patient.      Plan discussed with CRNA.    Attending anesthesiologist reviewed and agrees with Pre Eval content              Jennings Books, MD   04/25/2017

## 2017-04-25 NOTE — Discharge Instructions (Signed)
Dilation and Curettage: What to Expect at Home  Your Recovery  Dilation and curettage (D&C) is a procedure to remove tissue from the inside of the uterus. The doctor used a curved tool, called a curette, to gently scrape tissue from your uterus.  You are likely to have a backache, or cramps similar to menstrual cramps, and pass small clots of blood from your vagina for the first few days. You may continue to have light vaginal bleeding for several weeks after the procedure.  You will probably be able to go back to most of your normal activities in 1 or 2 days.  This care sheet gives you a general idea about how long it will take for you to recover. But each person recovers at a different pace. Follow the steps below to get better as quickly as possible.  How can you care for yourself at home?  Activity    Rest when you feel tired. Getting enough sleep will help you recover.     Avoid strenuous activities, such as bicycle riding, jogging, weight lifting, or aerobic exercise, until your doctor says it is okay.     Most women are able to return to work the day after the procedure.     You may have some light vaginal bleeding. Wear sanitary pads if needed. Do not douche or use tampons for 2 weeks or until your doctor says it is okay.     Ask your doctor when it is okay for you to have sex.     If you could become pregnant, talk about birth control with your doctor. Do not try to become pregnant until your doctor says it is okay.   Diet    You can eat your normal diet. If your stomach is upset, try bland, low-fat foods like plain rice, broiled chicken, toast, and yogurt.     Drink plenty of fluids (unless your doctor tells you not to).   Medicines    Your doctor will tell you if and when you can restart your medicines. He or she will also give you instructions about taking any new medicines.     If you take blood thinners, such as warfarin (Coumadin), clopidogrel (Plavix), or aspirin, be sure to  talk to your doctor. He or she will tell you if and when to start taking those medicines again. Make sure that you understand exactly what your doctor wants you to do.     Be safe with medicines. Take pain medicines exactly as directed.   If the doctor gave you a prescription medicine for pain, take it as prescribed.   If you are not taking a prescription pain medicine, ask your doctor if you can take an over-the-counter medicine.     If you think your pain medicine is making you sick to your stomach:   Take your medicine after meals (unless your doctor has told you not to).   Ask your doctor for a different pain medicine.     If your doctor prescribed antibiotics, take them as directed. Do not stop taking them just because you feel better. You need to take the full course of antibiotics.   Follow-up care is a key part of your treatment and safety. Be sure to make and go to all appointments, and call your doctor if you are having problems. It's also a good idea to know your test results and keep a list of the medicines you take.  When should you call for help?    Call 911 anytime you think you may need emergency care. For example, call if:    You passed out (lost consciousness).     You have chest pain, are short of breath, or cough up blood.   Call your doctor now or seek immediate medical care if:    You have bright red vaginal bleeding that soaks one or more pads in an hour, or you have large clots.     You have vaginal discharge that increases in amount or smells bad.     You are sick to your stomach or cannot drink fluids.     You have pain that does not get better after you take pain medicine.     You cannot pass stools or gas.     You have symptoms of a blood clot in your leg (called a deep vein thrombosis), such as:   Pain in your calf, back of the knee, thigh, or groin.   Redness and swelling in your leg.     You have signs of infection, such as:   Increased pain, swelling,  warmth, or redness.   Red streaks leading from the area.   Pus draining from the area.   A fever.   Watch closely for changes in your health, and be sure to contact your doctor if you have any problems.  Where can you learn more?  Go to [email or web address] and sign in to your MyChart account. Enter D453 in the Search Health Information box to learn more about "Dilation and Curettage: What to Expect at Home."     If you do not have an account, please click on the "Sign Up Now" link.  Current as of: August 22, 2016  Content Version: 11.6   2006-2018 [employer]. Care instructions adapted under license by [facility]. If you have questions about a medical condition or this instruction, always ask your healthcare professional. [employer] disclaims any warranty or liability for your use of this information.

## 2017-04-26 MED ORDER — HYDROCODONE-ACETAMINOPHEN 5-325 MG PO TABS
5-325 MG | ORAL_TABLET | Freq: Four times a day (QID) | ORAL | 0 refills | Status: AC | PRN
Start: 2017-04-26 — End: 2017-04-29

## 2017-05-24 ENCOUNTER — Encounter: Attending: Obstetrics & Gynecology | Primary: Internal Medicine

## 2017-05-29 ENCOUNTER — Ambulatory Visit
Admit: 2017-05-29 | Discharge: 2017-05-29 | Payer: PRIVATE HEALTH INSURANCE | Attending: Obstetrics & Gynecology | Primary: Internal Medicine

## 2017-05-29 DIAGNOSIS — Z09 Encounter for follow-up examination after completed treatment for conditions other than malignant neoplasm: Secondary | ICD-10-CM

## 2017-05-29 MED ORDER — METRONIDAZOLE 500 MG PO TABS
500 | ORAL_TABLET | Freq: Two times a day (BID) | ORAL | 0 refills | Status: AC
Start: 2017-05-29 — End: 2017-06-05

## 2017-05-29 NOTE — Progress Notes (Signed)
History and Physical  [facility]  [address] [place], [place] [address]  P: [phone] / F: [phone]  Lindsay Lawson  05/29/2017              36 y.o.  Chief Complaint   Patient presents with   . Post-Op Check     pt here post op check, D+C/Ablation 04/25/17. c/o discharge and slight odor   . Student     ok        BP 88/62   Temp 97.8 F (36.6 C)   Ht 5\' 11"  (1.803 m)   Wt 168 lb 6.4 oz (76.4 kg)   BMI 23.49 kg/m   Alllergies:  Adhesive tape; Cefaclor; Nsaids; Other; Polyurethane [urethane]; Soap; and Kiwi extract               Primary Care Physician: Georgena Spurling, MD    HPI : Lindsay Lawson is 36 y.o. female [phone] here today for her post-op examination. PT HAD D&C, hysteroscopy, endometrial ablation ON 04/25/17 . The patient was seen, she denies any complaints. She denied any shortness of breath, chest pain or dizziness. She denied any nausea, vomiting, or diarrhea. There is no fever, chills, or rigors. The patient denies any vaginal bleeding, PT STATES SHE DOES HAVE discharge AND odor, VAGINAL CULTURES COLLECTED. All of her pre-operative complaints are now resolved. ALL?S ANSWERED    ________________________________________________________________________  Obstetric History    G4   P3   T1   P2   A1   L3     SAB1   TAB0   Ectopic0   Molar0   Multiple0   Live Births3       # Outcome Date GA Lbr Len/2nd Weight Sex Delivery Anes PTL Lv   4 Preterm 03/15/10 [redacted]w[redacted]d   F CS-Unspec EPI  LIV   3 Term 09/14/08 [redacted]w[redacted]d   M Vag-Spont EPI  LIV   2 Preterm 03/02/02 [redacted]w[redacted]d   F Vag-Spont EPI  LIV      Complications: Placenta abruptio   1 SAB                 Past Medical History:   Diagnosis Date   . Anxiety    . Arthritis    . Back pain with sciatica    . DDD (degenerative disc disease), lumbar    . Fibromyalgia    . Hashimoto's disease     meds > 2 months   . History of kidney stones    . IBS (irritable bowel syndrome)    . Kidney disease, chronic, stage I (GFR  over 89 ml/min)    . Lupus (systemic lupus erythematosus) (HCC)    . MDRO (multiple drug resistant organisms) resistance 2010    camping /bite by spider rightbuttock crease / I & D / dx MRSA   . Migraines    . PTSD (post-traumatic stress disorder)    . Scoliosis    . Secondary osteoarthritis                                                                    Past Surgical History:   Procedure Laterality Date   . CESAREAN SECTION  2011   .  CHOLECYSTECTOMY     . COLONOSCOPY     . ENDOSCOPY, COLON, DIAGNOSTIC     . KNEE SURGERY Right     nerve block---    . PATELLA SURGERY Right     patella alignment   . PR HYSTEROSCOPY,W/ENDOMETRIAL ABLATION N/A 04/25/2017    D&C  HYSTEROSCOPY ABLATION performed by Carolin Sicks, MD at [facility] OR   . TUBAL LIGATION  2011    following csection   . URETER STENT PLACEMENT Bilateral     x6      Family History   Problem Relation Age of Onset   . Arthritis Mother         RA   . Other Mother         Hep C   . Diabetes Father    . Obesity Father    . Sleep Apnea Father    . Hypertension Father    . Elevated Lipids Father    . Other Father         had HEP C   . Liver Cancer Father    . Other Maternal Grandmother         aspestos exposure    . Cancer Maternal Grandfather         aspestos cancer   . Kidney Disease Paternal Grandmother    . Diabetes Paternal Grandfather 15   . No Known Problems Brother    . Seizures Daughter    . Depression Daughter    . Anxiety Disorder Daughter    . Anxiety Disorder Son    . Seizures Son      Social History     Social History   . Marital status: Married     Spouse name: N/A   . Number of children: N/A   . Years of education: N/A     Occupational History   . Not on file.     Social History Main Topics   . Smoking status: Former Smoker     Packs/day: 0.50     Years: 5.00     Types: Cigarettes     Quit date: 04/18/2006   . Smokeless tobacco: Never Used      Comment: quit 2008   . Alcohol use No   . Drug use: Yes     Types: Marijuana      Comment: occas chews a gummie  cannibus   . Sexual activity: Yes     Partners: Male     Other Topics Concern   . Not on file     Social History Narrative   . No narrative on file         MEDICATIONS:  Current Outpatient Prescriptions on File Prior to Visit   Medication Sig Dispense Refill   . levothyroxine (SYNTHROID) 25 MCG tablet Take 1 tablet by mouth once daily.  5   . HYDROcodone-acetaminophen (NORCO) 5-325 MG per tablet TAKE 1 TABLET BY MOUTH EVERY 6 HOURS AS NEEDED FOR PAIN for up to 7 (SEVEN) days  0   . SUMAtriptan (IMITREX) 100 MG tablet TAKE 1 TABLET AS NEEDED (max 2 (TWO) per day/12 per month)     . Methotrexate Sodium, PF, 200 MG/8ML SOLN 1ml sq injection once a week. No alcohol. Hold if on antibiotics or ill.     Marland Kitchen EPINEPHrine (EPIPEN) 0.3 MG/0.3ML SOAJ injection Inject 0.3 mg into the muscle     . amitriptyline (ELAVIL) 10 MG tablet Take 1-2 tablets by mouth daily at  bedtime.     . busPIRone (BUSPAR) 10 MG tablet TAKE 1 TABLET THREE TIMES DAILY     . citalopram (CELEXA) 40 MG tablet Take 1 tablet by mouth once daily.     . predniSONE (DELTASONE) 20 MG tablet Take 40 mg by mouth as needed      . dicyclomine (BENTYL) 20 MG tablet TAKE 1 TABLET BY MOUTH THREE TIMES DAILY AS NEEDED     . glycopyrrolate (ROBINUL) 2 MG tablet TAKE 1 TABLET TWICE DAILY     . tiZANidine (ZANAFLEX) 4 MG tablet TAKE 1 TABLET EVERY 8 HOURS AS NEEDED     . pregabalin (LYRICA) 225 MG capsule Take 225 mg by mouth 2 times daily. .     . folic acid (FOLVITE) 1 MG tablet Take 1 mg by mouth daily      . ondansetron (ZOFRAN-ODT) 4 MG disintegrating tablet Take 4 mg by mouth every 8 hours as needed      . omeprazole (PRILOSEC) 40 MG delayed release capsule TAKE 1 CAPSULE TWICE DAILY     . tretinoin (RETIN-A) 0.025 % cream Use sparingly to acne-prone areas at bedtime.       No current facility-administered medications on file prior to visit.          ALLERGIES:  Allergies as of 05/29/2017 - Review Complete 05/29/2017   Allergen Reaction Noted   . Adhesive tape Hives  and Other (See Comments) 02/23/2016   . Cefaclor Hives 10/01/2006   . Nsaids Other (See Comments) 12/24/2015   . Other  04/18/2017   . Polyurethane [urethane] Other (See Comments) 04/10/2017   . Soap  04/18/2017   . Kiwi extract Rash 06/01/2014           Gynecologic History:     No LMP recorded. Patient has had an ablation.      ________________________________________________________________________  REVIEW OF SYSTEMS:       Review of Systems   Respiratory: Negative for cough, shortness of breath and wheezing.    Cardiovascular: Negative for chest pain, palpitations and leg swelling.   Gastrointestinal: Negative for abdominal pain, blood in stool, constipation and diarrhea.   Genitourinary: Negative for difficulty urinating, dysuria, flank pain and hematuria.   Skin: Negative.    Psychiatric/Behavioral: Negative.                                                                                                                                                   PHYSICAL Exam:    Constitutional:  BP 88/62   Temp 97.8 F (36.6 C)   Ht 5\' 11"  (1.803 m)   Wt 168 lb 6.4 oz (76.4 kg)   BMI 23.49 kg/m   Vitals:    05/29/17 1131   BP: 88/62   Temp: 97.8 F (36.6 C)   Weight: 168 lb 6.4 oz (  76.4 kg)   Height: 5\' 11"  (1.803 m)       Physical Exam   Constitutional: She is oriented to person, place, and time. She appears well-developed and well-nourished.   HENT:   Head: Normocephalic and atraumatic.   Cardiovascular: Normal rate and regular rhythm.    Abdominal: Soft. Normal appearance. There is no tenderness.   Musculoskeletal: Normal range of motion.   Neurological: She is alert and oriented to person, place, and time.   Skin: Skin is warm and intact. No lesion noted. No erythema.   Psychiatric: She has a normal mood and affect. Her behavior is normal. Judgment and thought content normal.       Incision: Healing well    Pelvic exam: Vulva and vagina appear normal. Bimanual exam reveals normal uterus and  adnexa.      Results for orders placed or performed during the hospital encounter of 04/18/17   CBC Auto Differential   Result Value Ref Range    WBC 8.1 4.8 - 10.8 K/uL    RBC 4.47 4.20 - 5.40 M/uL    Hemoglobin 13.9 12.0 - 16.0 g/dL    Hematocrit 24.4 01.0 - 47.0 %    MCV 91.6 82.0 - 100.0 fL    MCH 31.2 27.0 - 31.3 pg    MCHC 34.0 33.0 - 37.0 %    RDW 13.7 11.5 - 14.5 %    Platelets 173 130 - 400 K/uL    Neutrophils % 62.3 %    Lymphocytes % 28.7 %    Monocytes % 6.5 %    Eosinophils % 1.4 %    Basophils % 1.1 %    Neutrophils # 5.0 1.4 - 6.5 K/uL    Lymphocytes # 2.3 1.0 - 4.8 K/uL    Monocytes # 0.5 0.2 - 0.8 K/uL    Eosinophils # 0.1 0.0 - 0.7 K/uL    Basophils # 0.1 0.0 - 0.2 K/uL   Comprehensive Metabolic Panel   Result Value Ref Range    Sodium 141 132 - 144 mEq/L    Potassium 3.6 3.5 - 5.1 mEq/L    Chloride 103 98 - 107 mEq/L    CO2 23 22 - 29 mEq/L    Anion Gap 15 (H) 7 - 13 mEq/L    Glucose 75 74 - 109 mg/dL    BUN 4 (L) 6 - 20 mg/dL    CREATININE 2.72 5.36 - 0.90 mg/dL    GFR Non-African American >60.0 >60    GFR African American >60.0 >60    Calcium 9.1 8.6 - 10.2 mg/dL    Total Protein 6.9 6.4 - 8.1 g/dL    Alb 4.5 3.9 - 4.9 g/dL    Total Bilirubin 1.4 (H) 0.0 - 1.2 mg/dL    Alkaline Phosphatase 45 40 - 130 U/L    ALT 10 0 - 33 U/L    AST 15 0 - 35 U/L    Globulin 2.4 2.3 - 3.5 g/dL   Urinalysis Reflex to Culture   Result Value Ref Range    Color, UA Yellow Straw/Yellow    Clarity, UA Clear Clear    Glucose, Ur Negative Negative mg/dL    Bilirubin Urine Negative Negative    Ketones, Urine Negative Negative mg/dL    Specific Gravity, UA 1.012 1.005 - 1.030    Blood, Urine Negative Negative    pH, UA 5.5 5.0 - 9.0    Protein, UA Negative Negative mg/dL    Urobilinogen, Urine  0.2 <2.0 E.U./dL    Nitrite, Urine Negative Negative    Leukocyte Esterase, Urine Negative Negative    Urine Reflex to Culture Not Indicated    Protime-INR   Result Value Ref Range    Protime 10.9 9.6 - 12.3 sec    INR 1.0    APTT    Result Value Ref Range    aPTT 28.7 21.6 - 35.4 sec   TYPE AND SCREEN   Result Value Ref Range    ABO/Rh A POS     Antibody Screen NEG          ASSESSMENT:      36 y.o. Annual   Diagnosis Orders   1. Postop check     2. S/P D&C (status post dilation and curettage)     3. S/P endometrial ablation     4. Vaginal discharge  Miscellaneous lab test #3           PROCEDURE: D&C, Hysteroscopy, endometrial ablation       PATIENT STABLE  PATHOLOGY REVIEWED AND FOUND TO BE BENIGN. YES                    PLAN:    Return for annual.  Orders Placed This Encounter   Procedures   . Miscellaneous lab test #3     Genital cultures     Standing Status:   Future     Standing Expiration Date:   05/29/2018     Orders Placed This Encounter   Medications   . metroNIDAZOLE (FLAGYL) 500 MG tablet     Sig: Take 1 tablet by mouth 2 times daily for 7 days     Dispense:  14 tablet     Refill:  0     PATIENT MAY RETURN TO NORMAL DUTIES  PELVIC REST LIFTED  LIFTING RESTRICTIONS LIFTED  PATIENT MAY RETURN TO HAVING SEXUAL INTERCOURSE       Pt was seen with total face to face time of 15 minutes with more than 50% of the visit being counseling and education regarding the encounter dx of   1. Postop check    2. S/P D&C (status post dilation and curettage)    3. S/P endometrial ablation    4. Vaginal discharge        I, Britney H Lakner, am scribing for, and in the presence of, Dr Conni Elliot. Electronically signed by: Leda Quail 05/29/17 12:25 PM    I, Dr Conni Elliot, personally performed the services described in this documentation, as scribed by Leda Quail in my presence, and it is both accurate and complete. Electronically signed by: Conni Elliot, MD 05/29/17 12:25 PM

## 2017-06-06 ENCOUNTER — Encounter

## 2017-06-15 MED ORDER — FLUCONAZOLE 150 MG PO TABS
150 | ORAL_TABLET | Freq: Once | ORAL | 1 refills | Status: AC
Start: 2017-06-15 — End: 2017-06-15

## 2017-06-15 NOTE — Telephone Encounter (Signed)
Pt made aware of yeast infection.     rx has been pended.

## 2017-06-15 NOTE — Telephone Encounter (Signed)
-----   Message from Carolin Sicks, MD sent at 06/14/2017  1:52 AM EDT -----  NOTIFY PT/PLS CALL DIFLUCAN 150MG  TO PT PHARMACY

## 2017-10-11 ENCOUNTER — Ambulatory Visit
Admit: 2017-10-11 | Discharge: 2017-10-11 | Payer: PRIVATE HEALTH INSURANCE | Attending: Obstetrics & Gynecology | Primary: Internal Medicine

## 2017-10-11 DIAGNOSIS — N6320 Unspecified lump in the left breast, unspecified quadrant: Secondary | ICD-10-CM

## 2017-10-11 DIAGNOSIS — N6310 Unspecified lump in the right breast, unspecified quadrant: Secondary | ICD-10-CM

## 2017-10-11 NOTE — Progress Notes (Signed)
Patient here c/o B/L breast pain and feels B/L cysts.  States she had a dx mammogram and [place] 6/18 that revealed several small, benign breast cysts.  Denies nipple discharge.  Sx started 5 days ago.  Denies trauma or increased activity.  B/L exam negative for palpable masses today.  Patient staes she felt mass in left axillae, but unable to palpate in office today.  All cysts on previous US very small ( 1 cm or less ).  Rads recommended mammogram 1 year ( 6/19 ).  Discussed with patient and , because she seemed very concerned, offered repeat mammogram and [place] now vs continued SBE, decreasing caffeine and form fitting bra and F/U in 6 months when retesting recommended.  Patient declined repeat imaging now and states she will see her PCP to discuss and R/O cardiac issues as intermittent pain on left.  No specific cardiac complaints today.  F/U annual exam or prn.      Vitals:  BP 102/70   Ht 5\' 11"  (1.803 m)   Wt 165 lb (74.8 kg)   BMI 23.01 kg/m   Past Medical History:   Diagnosis Date   . Anxiety    . Arthritis    . Back pain with sciatica    . DDD (degenerative disc disease), lumbar    . Fibromyalgia    . Hashimoto's disease     meds > 2 months   . History of kidney stones    . IBS (irritable bowel syndrome)    . 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 Lupus (systemic lupus erythematosus) (HCC)    . MDRO (multiple drug resistant organisms) resistance 2010    camping /bite by spider rightbuttock crease / I & D / dx MRSA   . Migraines    . PTSD (post-traumatic stress disorder)    . Scoliosis    . Secondary osteoarthritis      Past Surgical History:   Procedure Laterality Date   . CESAREAN SECTION  2011   . CHOLECYSTECTOMY     . COLONOSCOPY     . ENDOSCOPY, COLON, DIAGNOSTIC     . KNEE SURGERY Right     nerve block---    . PATELLA SURGERY Right     patella alignment   . PR HYSTEROSCOPY,W/ENDOMETRIAL ABLATION N/A 04/25/2017    D&C  HYSTEROSCOPY ABLATION performed by Carolin Sicks, MD at [facility] OR   .  TUBAL LIGATION  2011    following csection   . URETER STENT PLACEMENT Bilateral     x6      Allergies:  Adhesive tape; Cefaclor; Nsaids; Other; Polyurethane [urethane]; Soap; and Kiwi extract  Current Outpatient Prescriptions   Medication Sig Dispense Refill   . mirtazapine (REMERON) 30 MG tablet Take 30 mg by mouth     . leflunomide (ARAVA) 10 MG tablet Take half to full tab by mouth with food. No alcohol. Hold if on antibiotics or ill.  3   . leucovorin calcium (WELLCOVORIN) 5 MG tablet take 2 (TWO) to 3 (THREE) CAPSULES BY MOUTH the day after methotrexate  3   . predniSONE (DELTASONE) 5 MG tablet Day 1=6tabs with food, Day 2=5tabs, Day 3=4tabs, Day 4=3tabs, Day 5=2tabs, then take 1-2tabs daily thereafter if needed, No NSAIDs on med  1   . levothyroxine (SYNTHROID) 25 MCG tablet Take 1 tablet by mouth once daily.  5   . HYDROcodone-acetaminophen (NORCO) 5-325 MG per tablet TAKE 1 TABLET BY  MOUTH EVERY 6 HOURS AS NEEDED FOR PAIN for up to 7 (SEVEN) days  0   . SUMAtriptan (IMITREX) 100 MG tablet TAKE 1 TABLET AS NEEDED (max 2 (TWO) per day/12 per month)     . Methotrexate Sodium, PF, 200 MG/8ML SOLN 1ml sq injection once a week. No alcohol. Hold if on antibiotics or ill.     Marland Kitchen. EPINEPHrine (EPIPEN) 0.3 MG/0.3ML SOAJ injection Inject 0.3 mg into the muscle     . amitriptyline (ELAVIL) 10 MG tablet Take 1-2 tablets by mouth daily at bedtime.     . busPIRone (BUSPAR) 10 MG tablet TAKE 1 TABLET THREE TIMES DAILY     . citalopram (CELEXA) 40 MG tablet Take 1 tablet by mouth once daily.     . predniSONE (DELTASONE) 20 MG tablet Take 40 mg by mouth as needed      . dicyclomine (BENTYL) 20 MG tablet TAKE 1 TABLET BY MOUTH THREE TIMES DAILY AS NEEDED     . glycopyrrolate (ROBINUL) 2 MG tablet TAKE 1 TABLET TWICE DAILY     . tiZANidine (ZANAFLEX) 4 MG tablet TAKE 1 TABLET EVERY 8 HOURS AS NEEDED     . pregabalin (LYRICA) 225 MG capsule Take 225 mg by mouth 2 times daily. .     . folic acid (FOLVITE) 1 MG tablet Take 1 mg by  mouth daily      . ondansetron (ZOFRAN-ODT) 4 MG disintegrating tablet Take 4 mg by mouth every 8 hours as needed      . omeprazole (PRILOSEC) 40 MG delayed release capsule TAKE 1 CAPSULE TWICE DAILY     . tretinoin (RETIN-A) 0.025 % cream Use sparingly to acne-prone areas at bedtime.       No current facility-administered medications for this visit.      Social History     Social History   . Marital status: Married     Spouse name: N/A   . Number of children: N/A   . Years of education: N/A     Occupational History   . Not on file.     Social History Main Topics   . Smoking status: Former Smoker     Packs/day: 0.50     Years: 5.00     Types: Cigarettes     Quit date: 04/18/2006   . Smokeless tobacco: Never Used      Comment: quit 2008   . Alcohol use No   . Drug use: Yes     Types: Marijuana      Comment: occas chews a gummie cannibus   . Sexual activity: Yes     Partners: Male     Other Topics Concern   . Not on file     Social History Narrative   . No narrative on file     Family History   Problem Relation Age of Onset   . Arthritis Mother         RA   . Other Mother         Hep C   . Diabetes Father    . Obesity Father    . Sleep Apnea Father    . Hypertension Father    . Elevated Lipids Father    . Other Father         had HEP C   . Liver Cancer Father    . Other Maternal Grandmother         aspestos exposure    . Cancer  Maternal Grandfather         aspestos cancer   . Kidney Disease Paternal Grandmother    . Diabetes Paternal Grandfather 18   . No Known Problems Brother    . Seizures Daughter    . Depression Daughter    . Anxiety Disorder Daughter    . Anxiety Disorder Son    . Seizures Son        Review of Systems   Constitutional: Negative.  Negative for activity change, appetite change, chills, diaphoresis, fatigue, fever and unexpected weight change.   HENT: Negative.    Eyes: Negative.    Respiratory: Negative.    Cardiovascular: Negative.    Gastrointestinal: Negative for abdominal distention, abdominal  pain, anal bleeding, blood in stool, constipation, diarrhea, nausea, rectal pain and vomiting.   Endocrine: Negative.    Genitourinary: Negative for decreased urine volume, difficulty urinating, dyspareunia, dysuria, enuresis, flank pain, frequency, genital sores, hematuria, menstrual problem, pelvic pain, urgency, vaginal bleeding, vaginal discharge and vaginal pain.   Musculoskeletal: Negative.    Skin: Negative.    Allergic/Immunologic: Negative.    Neurological: Negative.    Hematological: Negative.    Psychiatric/Behavioral: Negative.    B/L mastodynia    Objective:     Physical Exam   Constitutional: She is oriented to person, place, and time. She appears well-developed and well-nourished. No distress.   HENT:   Head: Normocephalic and atraumatic.   Eyes: Conjunctivae are normal.   Neck: Normal range of motion. Neck supple.   Cardiovascular: Normal rate and regular rhythm.    Pulmonary/Chest: Effort normal. No respiratory distress. Chest wall is not dull to percussion. She exhibits tenderness. She exhibits no mass, no bony tenderness, no laceration, no crepitus, no edema, no deformity, no swelling and no retraction.   Musculoskeletal: Normal range of motion. She exhibits no edema, tenderness or deformity.   Neurological: She is alert and oriented to person, place, and time. She exhibits normal muscle tone. Coordination normal.   Skin: Skin is warm and dry. She is not diaphoretic. No pallor.   Psychiatric: She has a normal mood and affect. Her behavior is normal. Judgment and thought content normal.       Assessment:       Diagnosis Orders   1. Bilateral breast lump  US Breast Bilateral   2. Pain of both breasts  US Breast Bilateral        Plan:      Medications placedthis encounter:  No orders of the defined types were placed in this encounter.        Orders placedthis encounter:  Orders Placed This Encounter   Procedures   . US Breast Bilateral     Standing Status:   Future     Standing Expiration Date:    12/11/2018         Follow up:  Return for Annual, PRN.

## 2018-02-19 ENCOUNTER — Inpatient Hospital Stay: Admit: 2018-02-19 | Payer: PRIVATE HEALTH INSURANCE | Primary: Internal Medicine

## 2018-02-19 ENCOUNTER — Encounter

## 2018-02-19 ENCOUNTER — Telehealth

## 2018-02-19 ENCOUNTER — Inpatient Hospital Stay: Payer: PRIVATE HEALTH INSURANCE | Primary: Internal Medicine

## 2018-02-19 DIAGNOSIS — N6310 Unspecified lump in the right breast, unspecified quadrant: Secondary | ICD-10-CM

## 2018-02-19 DIAGNOSIS — N644 Mastodynia: Secondary | ICD-10-CM

## 2018-02-19 DIAGNOSIS — N63 Unspecified lump in unspecified breast: Secondary | ICD-10-CM

## 2018-02-19 NOTE — Telephone Encounter (Signed)
Received call from Maralyn Sago at [facility] that patient scheduled today for breast u/s from a order that was placed in Jan 2019. Per Dr Montez Morita patient to have screening mammogram today with attached order for breast u/s prn. I spoke to Maralyn Sago at Radiology and informed that new orders will be placed.

## 2018-02-21 ENCOUNTER — Telehealth

## 2018-02-21 NOTE — Telephone Encounter (Signed)
Please advise

## 2018-02-21 NOTE — Telephone Encounter (Signed)
Pt had a mammogram done 02-19-18 and is asking for a call  back with the results  Call [phone]

## 2018-02-21 NOTE — Telephone Encounter (Signed)
Pt aware and referral placed

## 2018-02-21 NOTE — Telephone Encounter (Signed)
Please make patient appointment with Dr Allena Katz for consult for complicated findings on mammogram.  Recommended F/U right dx mammogram and [place] right 6 months and MRI of left breast now.  Instruct patient to wait to see Dr Allena Katz for further recommendation on upcoming tests.

## 2018-02-26 ENCOUNTER — Ambulatory Visit
Admit: 2018-02-26 | Discharge: 2018-02-26 | Payer: PRIVATE HEALTH INSURANCE | Attending: Surgery | Primary: Internal Medicine

## 2018-02-26 DIAGNOSIS — R928 Other abnormal and inconclusive findings on diagnostic imaging of breast: Secondary | ICD-10-CM

## 2018-02-26 NOTE — Patient Instructions (Signed)
Patient Education        Breast Lumps (Noncancerous): Care Instructions  Your Care Instructions  Breast lumps or changes are a common health worry for most women. Women may have many kinds of breast lumps and other breast changes throughout their lives, including ones that occur with menstrual periods, pregnancy, and aging. Most breast lumps are harmless and are not cancer.  Many women's breasts feel lumpy and tender before their menstrual periods. Women also may have lumps when they are breastfeeding. Breast lumps may go away after menopause.  Common noncancerous breast lumps include:  ?? Cysts, or sacs filled with fluid.  ?? Fatty lumps, which may be firm. These may or may not cause pain.  ?? Fibroadenomas, growths that are round and firm with smooth edges.  ?? Abscesses, which are pockets of infection within the breast.  Follow-up care is a key part of your treatment and safety. Be sure to make and go to all appointments, and call your doctor if you are having problems. It's also a good idea to know your test results and keep a list of the medicines you take.  How can you care for yourself at home?  ?? Take an over-the-counter pain medicine, such as acetaminophen (Tylenol), ibuprofen (Advil, Motrin), or naproxen (Aleve). Read and follow all instructions on the label.  ?? Do not take two or more pain medicines at the same time unless the doctor told you to. Many pain medicines have acetaminophen, which is Tylenol. Too much acetaminophen (Tylenol) can be harmful.  ?? If you are pregnant, do not take any medicine other than acetaminophen unless your doctor has told you to.  ?? Wear a supportive bra, such as a sports bra or jog bra.  ?? You may want to limit caffeine. Some women say that cutting back on caffeine reduces their breast tenderness.  ?? A diet very low in fat (about 15% of daily diet) may reduce breast tenderness. Talk to your doctor about whether you should try a very low-fat diet.  ?? A diet low in salt (sodium)  also may reduce breast tenderness.  When should you call for help?  Watch closely for changes in your health, and be sure to contact your doctor if you:  ?? ?? You do not get better as expected.   ?? ?? Your breast has changed.   ?? ?? You have pain in your breast.   ?? ?? You have a discharge from your nipple.   ?? ?? A breast lump changes or does not go away.   Where can you learn more?  Go to [email or web address] and sign in to your MyChart account. Enter B530 in the Search Health Information box to learn more about "Breast Lumps (Noncancerous): Care Instructions."     If you do not have an account, please click on the "Sign Up Now" link.  Current as of: Feb 12, 2017  Content Version: 12.0  ?? 2006-2019 [employer]. Care instructions adapted under license by [facility]. If you have questions about a medical condition or this instruction, always ask your healthcare professional. [employer] disclaims any warranty or liability for your use of this information.         Patient Education        Fibrocystic Breast Changes: Care Instructions  Your Care Instructions  Fibrocystic breast changes cause many small lumps to form in your breast. Some areas of your breast may feel thicker or denser than other areas. Your breasts also   may feel sore or tender.  You may notice lumps in both breasts around the nipple and in the upper, outer part of the breasts, especially before your menstrual period. The lumps may come and go and change size in just a few days.  Fibrocystic breast changes are normal and are not cancer. Treatment is not usually needed. If you have a hard, grainy lump, unusual pain, or nipple discharge, your doctor may order tests to look for a more serious problem. Talk to your doctor about the need for regular mammograms.  Follow-up care is a key part of your treatment and safety. Be sure to make and go to all appointments, and call your doctor if you are having problems. It's  also a good idea to know your test results and keep a list of the medicines you take.  How can you care for yourself at home?  ?? Take an over-the-counter pain medicine, such as acetaminophen (Tylenol), ibuprofen (Advil, Motrin), or naproxen (Aleve). Read and follow all instructions on the label.  ?? Do not take two or more pain medicines at the same time unless the doctor told you to. Many pain medicines have acetaminophen, which is Tylenol. Too much acetaminophen (Tylenol) can be harmful.  ?? Wear a supportive bra, such as a sports bra or jog bra.  ?? You may want to limit caffeine. Some women say that cutting back on caffeine reduces breast tenderness.  ?? A diet very low in fat (about 15% of daily diet) may reduce breast tenderness. Talk to your doctor about whether you should try a very low-fat diet.  When should you call for help?  Watch closely for changes in your health, and be sure to contact your doctor if:  ?? ?? You do not get better as expected.   ?? ?? Your breast has changed.   ?? ?? You have pain in your breast.   ?? ?? You have a discharge from your nipple.   ?? ?? A breast lump changes or does not go away.   Where can you learn more?  Go to [email or web address] and sign in to your MyChart account. Enter J802 in the Search Health Information box to learn more about "Fibrocystic Breast Changes: Care Instructions."     If you do not have an account, please click on the "Sign Up Now" link.  Current as of: Feb 12, 2017  Content Version: 12.0  ?? 2006-2019 [employer]. Care instructions adapted under license by [facility]. If you have questions about a medical condition or this instruction, always ask your healthcare professional. [employer] disclaims any warranty or liability for your use of this information.

## 2018-02-26 NOTE — Progress Notes (Signed)
NEW BREAST PATIENT         SERVICE DATE: 02/26/18  SERVICE TIME:  10:22 AM    REFERRED BY:  Almyra Deforest, MD  REASON FOR TODAY'S VISIT:    Chief Complaint   Patient presents with   ??? Abnormal Mammogram     3 Cysts Left breast, Cyst Right Breast   ??? Abnormal Ultrasound     3 Cysts Left breast, cyst right breast   ??? Breast Pain     Left Breast      CHAPERONE WAS OFFERED, PATIENT RESPONDED: no    HISTORY AND CHIEF COMPLAINT:  Lindsay Lawson is a 37 y.o. Caucasian female who presents with the complaint of having Abnormal mammogram, Cysts Bilat, Left Breast Pain.   Started 1.5 years ago, stronger and persistent. Bothersome when she is doing physical activity. She lost her original pcp and feels her fibromyalgia is not well controlled. She has lost 20 lbs due to stress.    BREAST HISTORY  Her past breast history (prior to this encounter) is as follows:  Abnormal mammogram:   Yes  Abnormal Breast US:  Yes  Breast biopsy:    No  Breast cysts:    Yes  Breast surgery:    No  Breast cancer              No    RISK FACTORS FOR BREAST CANCER:  Family History of Breast Cancer: Paternal Grandmother in 60-70's, Paternal Aunt age 9, Paternal Aunt age 37  History of ovarian cancer: no  Ashkenazi Ancestry: no  Age at the birth of first child: 64  Age at the onset of menses: 35  Age at menopause: n/a   Hormonal therapy: no  Postmenopausal obesity: no    BRA SIZE: 41 D    Past Medical History:   Diagnosis Date   ??? Anxiety    ??? Arthritis    ??? Back pain with sciatica    ??? DDD (degenerative disc disease), lumbar    ??? Fibromyalgia    ??? Hashimoto's disease     meds > 2 months   ??? History of kidney stones    ??? IBS (irritable bowel syndrome)    ??? Kidney disease, chronic, stage I (GFR over 89 ml/min)    ??? Lupus (systemic lupus erythematosus) (HCC)    ??? MDRO (multiple drug resistant organisms) resistance 2010    camping /bite by spider rightbuttock crease / I & D / dx MRSA   ??? Migraines    ??? PTSD (post-traumatic stress disorder)    ???  Scoliosis    ??? Secondary osteoarthritis      Past Surgical History:   Procedure Laterality Date   ??? CESAREAN SECTION  2011   ??? CHOLECYSTECTOMY     ??? COLONOSCOPY     ??? ENDOSCOPY, COLON, DIAGNOSTIC     ??? KNEE SURGERY Right     nerve block---    ??? PATELLA SURGERY Right     patella alignment   ??? PR HYSTEROSCOPY,W/ENDOMETRIAL ABLATION N/A 04/25/2017    D&C  HYSTEROSCOPY ABLATION performed by Carolin Sicks, MD at [facility] OR   ??? TUBAL LIGATION  2011    following csection   ??? URETER STENT PLACEMENT Bilateral     x6      Family History   Problem Relation Age of Onset   ??? Arthritis Mother         RA   ??? Other Mother  Hep C   ??? Diabetes Father    ??? Obesity Father    ??? Sleep Apnea Father    ??? Hypertension Father    ??? Elevated Lipids Father    ??? Other Father         had HEP C   ??? Liver Cancer Father    ??? Other Maternal Grandmother         aspestos exposure    ??? Cancer Maternal Grandfather         aspestos cancer   ??? Kidney Disease Paternal Grandmother    ??? Diabetes Paternal Grandfather 57   ??? No Known Problems Brother    ??? Seizures Daughter    ??? Depression Daughter    ??? Anxiety Disorder Daughter    ??? Anxiety Disorder Son    ??? Seizures Son      Social History     Socioeconomic History   ??? Marital status: Married     Spouse name: Not on file   ??? Number of children: Not on file   ??? Years of education: Not on file   ??? Highest education level: Not on file   Occupational History   ??? Not on file   Social Needs   ??? Financial resource strain: Not on file   ??? Food insecurity:     Worry: Not on file     Inability: Not on file   ??? Transportation needs:     Medical: Not on file     Non-medical: Not on file   Tobacco Use   ??? Smoking status: Former Smoker     Packs/day: 0.50     Years: 5.00     Pack years: 2.50     Types: Cigarettes     Last attempt to quit: 04/18/2006     Years since quitting: 11.8   ??? Smokeless tobacco: Never Used   ??? Tobacco comment: quit 2008   Substance and Sexual Activity   ??? Alcohol use: No   ??? Drug use: Yes      Types: Marijuana     Comment: occas chews a gummie cannibus   ??? Sexual activity: Yes     Partners: Male   Lifestyle   ??? Physical activity:     Days per week: Not on file     Minutes per session: Not on file   ??? Stress: Not on file   Relationships   ??? Social connections:     Talks on phone: Not on file     Gets together: Not on file     Attends religious service: Not on file     Active member of club or organization: Not on file     Attends meetings of clubs or organizations: Not on file     Relationship status: Not on file   ??? Intimate partner violence:     Fear of current or ex partner: Not on file     Emotionally abused: Not on file     Physically abused: Not on file     Forced sexual activity: Not on file   Other Topics Concern   ??? Not on file   Social History Narrative   ??? Not on file     Under the care of PCP for all positive symptoms  Review of Systems   Constitutional: Positive for activity change, appetite change, chills, diaphoresis, fatigue, fever and unexpected weight change (weight loss).   HENT: Negative for congestion, ear pain, hearing loss,  mouth sores, nosebleeds and sore throat.    Eyes: Positive for pain.   Respiratory: Negative for cough, chest tightness and shortness of breath.    Cardiovascular: Negative for palpitations and leg swelling. Chest pain: Denies SOB at this time, stated this is common with her underlying medical issues, No dyspnea observed at this time.   Gastrointestinal: Positive for abdominal pain, constipation, diarrhea and nausea. Negative for vomiting.   Endocrine: Positive for cold intolerance and polyuria. Negative for heat intolerance, polydipsia and polyphagia.   Genitourinary: Positive for dysuria (History of scarring in the ureter, pain with urination is common. Denies any odor to urine .      ) and hematuria. Negative for difficulty urinating, menstrual problem and vaginal bleeding.   Musculoskeletal: Positive for arthralgias, back pain and myalgias. Negative for neck  pain and neck stiffness.   Skin: Negative for color change, pallor and wound.   Allergic/Immunologic: Positive for environmental allergies, food allergies and immunocompromised state.   Neurological: Positive for dizziness and headaches. Negative for weakness.   Hematological: Does not bruise/bleed easily.   Psychiatric/Behavioral: Negative for agitation, confusion, sleep disturbance and suicidal ideas. The patient is nervous/anxious (depression, anxiety).        Have you ever tested positive for AIDS? no  Have you ever tested positive for Hepatitis? 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 Her smoking use included cigarettes. She has a 2.50 pack-year smoking history. She has never used smokeless tobacco.  Alcohol use: Tanaisha  reports that she does not drink alcohol.  Drug use: Zulma  reports that she has current or past drug history. Drug: Marijuana.  Caffeine intake: 2 cups of caffeinated coffee per day(s)  Exercise:  moderately active    BP 120/70 (Site: Left Upper Arm)    Pulse 62    Resp 18    Ht  (1.803 m)    Wt 156 lb 12.8 oz (71.1 kg)    BMI 21.87 kg/m??     Physical Exam   Constitutional: She is oriented to person, place, and time. She appears well-developed and well-nourished. She is cooperative.   HENT:   Head: Normocephalic and atraumatic.   Nose: Nose normal.   Eyes: Conjunctivae are normal. Right conjunctiva has no hemorrhage. Left conjunctiva has no hemorrhage.   Neck: Normal range of motion. Neck supple.   Cardiovascular: Normal rate.   Pulmonary/Chest: Effort normal. No respiratory distress. Right breast exhibits no inverted nipple, no mass, no nipple discharge, no skin change and no tenderness. Left breast exhibits no inverted nipple, no mass, no nipple discharge, no skin change and no tenderness. No breast swelling, tenderness (fibrocystic  changes bilaterally), discharge or bleeding. Breasts are symmetrical.   Abdominal: Normal appearance.   Genitourinary: No breast swelling, tenderness, discharge or bleeding.   Musculoskeletal: Normal range of motion.   Ambulates with cane due to herniated disc and right knee problems     Lymphadenopathy:     She has no cervical adenopathy (benign palpable mobile smooth lymph nodes bilaterally - PCP aware).        Right cervical: No superficial cervical, no deep cervical and no posterior cervical adenopathy present.       Left cervical: No superficial cervical, no deep cervical and no posterior cervical adenopathy present.     She  has no axillary adenopathy.        Right axillary: No pectoral and no lateral adenopathy present.        Left axillary: No pectoral and no lateral adenopathy present.       Right: No supraclavicular adenopathy present.        Left: No supraclavicular adenopathy present.   Neurological: She is alert and oriented to person, place, and time. She is not disoriented.   Skin: Skin is warm and dry. No abrasion noted. No erythema.   Psychiatric: Her speech is normal and behavior is normal. Judgment and thought content normal. Cognition and memory are normal.   Vitals reviewed.    RADIOGRAPHIC FINDINGS:    Mam Digital Diagnostic W Or Wo Cad Bilateral    Result Date: 02/19/2018  EXAMINATION: MAM DIGITAL DIAGNOSTIC W OR WO CAD BILATERAL CLINICAL HISTORY:N64.4 Pain of both breasts ICD10 COMPARISON: March 16, 2017 FINDINGS:3-D tomosynthesis imaging of the bilateral breasts was performed. The breast parenchyma is heterogeneously dense bilaterally which may obscure small masses.  Right breast: At middle third depth in the slightly outer right breast there is an area of asymmetry labeled #1 on the right medial lateral oblique C view. There is a corresponding 4 mm cyst is seen on the ultrasound of this area that could account for this density. There is a second small focal asymmetry in the lower right breast  at middle third depth labeled #2. There is a 1.1 x 0.3 cm complex cystic area at the 7:00 position of the right breast approximately 4 cm from the nipple seen on the corresponding ultrasound scan that might correlate with this mammographic finding. The area of palpable concern in the right breast shows no mammographic or ultrasound abnormality. Left breast: In the upper central left breast at posterior third depth there is an irregular lobulated mass area measuring approximately 2.5 cm labeled #3 on the left medial lateral oblique view. Just medial to this area is a smaller asymmetric nodular density seen best on the cc view. This is labeled #4. These densities may overlap on the mediolateral oblique view and account for the more worrisome appearance on that view. This asymmetry was present on the prior study but appears more striking on today's exam. Correlation of this area with ultrasound reveals a complex/cystic solid oval lesion measuring approximately 10 x 5 mm at the 11:00 position of the left breast 5 cm from the nipple. This could contribute to the mammographic appearance of lesion 3 but findings are somewhat discordant. The ultrasound scans of the left breast does show a complex 1.3 cm cyst at 12:00 but this was located in the more anterior portion of the breast and would not correlate well with the mammographic findings. In addition at the 3:00 position in the left breast there is an 8 mm hypoechoic lesion not clearly seen mammographically. These findings are problematic, particularly the lobulated density in the upper left breast at 12:00. Additional correlation with MRI imaging is recommended for further clarity. Short-term follow-up mammography and ultrasound of the right breast can be performed in 6 months to ensure stability of these likely benign findings in the right breast.  CAD analysis was performed and used in the interpretation.     Recommend short-term follow-up mammogram and ultrasound of  the right breast in 6 months. Complex lesions in the left breast do not unequivocally correspond to benign ultrasound findings. Further follow-up imaging with MRI for additional accuracy is recommended. The lobulated lesion at 12:00 in  the left breast is of particular concern.                           Right breast: Lesion 1: asymmetry upper outer quadrant possible cyst                                                 Lesion 2: lower right breast possible cyst                           Left breast: Lesion 3: 2.5 cm lobulated lesion at 12:00                                               Lesion 4: 8 mm asymmetry medial to the 12:00 lesion                                               Lesion 5: 8 mm hypoechoic lesion seen only on ultrasound at 3:00                                              BI-RADS 0: NEED ADDITIONAL IMAGING EVALUATION. Board Certified Radiologists.  Accredited by the ACR and FDA. MAMMOGRAPHY IS VERY IMPORTANT TO YOUR HEALTH.  THE AMERICAN CANCER SOCIETY GUIDELINES RECOMMEND THAT WOMEN 50 YEARS OF AGE AND OLDER SHOULD HAVE A MAMMOGRAM EVERY YEAR. A REMINDER LETTER WILL BE SENT AT THE APPROPRIATE TIME.  THIS FACILITY UTILIZES A REMINDER SYSTEM TO ENSURE ALL PATIENTS RECEIVE REMINDER NOTIFICATIONS AT THE APPROPRIATE TIME BASED ON THE RECOMMENDATIONS OF THIS EXAM. THIS INCLUDES REMINDERS FOR ROUTINE  SCREENING MAMMOGRAMS, DIAGNOSTIC MAMMOGRAMS IN WHICH THE PATIENT IS ASKED TO RETURN FOR ADDITIONAL VIEWS, OR OTHER BREAST IMAGING INTERVENTIONS WHEN APPROPRIATE. THE PATIENT WILL BE PLACED IN THE APPROPRIATE REMINDER SYSTEM INCLUDING A REMINDER AT THE APPROPRIATE TIME FOR ANY PENDING ADDITIONAL VIEWS.     US Breast Limited Left    Result Date: 02/20/2018  EXAMINATION: US BREAST LIMITED RIGHT COMPARISON:None CLINICAL HISTORY: N64.4 Pain of both breasts ICD10 FINDINGS: Targeted ultrasound scans of the right breast are done in conjunction with diagnostic mammogram of the right breast. 1. At the 11:00 position of  the right breast 4 cm from the nipple there is a 4 x 4 x 3 mm well-circumscribed anechoic nodule consistent with a cyst. 2. At the 7:00 position of the right breast 4 cm from the nipple there is an oval 1.2 x 0.9 x 0.3 cm complex cyst. 3. At 7:00 approximately 4 cm from the nipple there is an oval 5 x 5 x 2 mm cyst. 4. At the 6:00 position of the breast 1 cm deep to the nipple there is a 7 mm cyst.     SEVERAL PROBABLE CYSTS AS DISCUSSED ABOVE. BI-RADS 3: PROBABLY BENIGN SHORT-TERM 6 MONTH FOLLOW-UP STUDY RECOMMENDED EXAMINATION: US BREAST LIMITED LEFT COMPARISON:None CLINICAL HISTORY: Study done in combination with  the diagnostic mammogram of the left breast  N64.4 Pain of both breasts ICD10 FINDINGS: Targeted ultrasound scans of the left breast performed in conjunction with diagnostic mammogram. 1. At 12:00 approximately 1 cm from the nipple there is a complex oval/filiform cystic nodule measuring approximately 1.6 x 0.5 cm. This does not appear to correlate with the 1.2 cm cyst at 12:00 seen in the left breast on the previous ultrasound scans as it is only 1 cm deep to the nipple and not 5 cm deep to the nipple as previously noted. This most likely is a separate cystic area on today's exam. 2. At 11:00 approximately 5 cm from the nipple there is a 10 x 5 x 9 mm complex cystic mass. This may in part contribute to the abnormal density seen on the current left breast mammogram in the central upper left breast but changes mammographically are more striking. 3. At 3:00 in the left breast approximately 3 cm from the nipple there is a 7 x 8 x 2 mm minimally complex cystic area. 4. At 4:00 in the left breast approximately 4 cm from the nipple there is an oval 7 x 4 mm complex relatively hypoechoic nodule. No clearly corresponding mammographic finding is noted at the 4:00 position of the left breast. Close short-term follow-up ultrasound of the left breast recommended to assess for stability of this finding. IMPRESSION:  MULTIPLE LEFT BREAST NODULES/CYSTS. RECOMMEND SHORT-TERM 6 MONTH FOLLOW-UP ULTRASOUND. ADDITIONAL RECOMMENDATIONS PROVIDED ON THE MAMMOGRAPHY REPORT.    US Breast Limited Right    Result Date: 02/20/2018  EXAMINATION: US BREAST LIMITED RIGHT COMPARISON:None CLINICAL HISTORY: N64.4 Pain of both breasts ICD10 FINDINGS: Targeted ultrasound scans of the right breast are done in conjunction with diagnostic mammogram of the right breast. 1. At the 11:00 position of the right breast 4 cm from the nipple there is a 4 x 4 x 3 mm well-circumscribed anechoic nodule consistent with a cyst. 2. At the 7:00 position of the right breast 4 cm from the nipple there is an oval 1.2 x 0.9 x 0.3 cm complex cyst. 3. At 7:00 approximately 4 cm from the nipple there is an oval 5 x 5 x 2 mm cyst. 4. At the 6:00 position of the breast 1 cm deep to the nipple there is a 7 mm cyst.     SEVERAL PROBABLE CYSTS AS DISCUSSED ABOVE. BI-RADS 3: PROBABLY BENIGN SHORT-TERM 6 MONTH FOLLOW-UP STUDY RECOMMENDED EXAMINATION: US BREAST LIMITED LEFT COMPARISON:None CLINICAL HISTORY: Study done in combination with the diagnostic mammogram of the left breast  N64.4 Pain of both breasts ICD10 FINDINGS: Targeted ultrasound scans of the left breast performed in conjunction with diagnostic mammogram. 1. At 12:00 approximately 1 cm from the nipple there is a complex oval/filiform cystic nodule measuring approximately 1.6 x 0.5 cm. This does not appear to correlate with the 1.2 cm cyst at 12:00 seen in the left breast on the previous ultrasound scans as it is only 1 cm deep to the nipple and not 5 cm deep to the nipple as previously noted. This most likely is a separate cystic area on today's exam. 2. At 11:00 approximately 5 cm from the nipple there is a 10 x 5 x 9 mm complex cystic mass. This may in part contribute to the abnormal density seen on the current left breast mammogram in the central upper left breast but changes mammographically are more striking. 3. At  3:00 in the left breast approximately 3 cm from the nipple there  is a 7 x 8 x 2 mm minimally complex cystic area. 4. At 4:00 in the left breast approximately 4 cm from the nipple there is an oval 7 x 4 mm complex relatively hypoechoic nodule. No clearly corresponding mammographic finding is noted at the 4:00 position of the left breast. Close short-term follow-up ultrasound of the left breast recommended to assess for stability of this finding. IMPRESSION: MULTIPLE LEFT BREAST NODULES/CYSTS. RECOMMEND SHORT-TERM 6 MONTH FOLLOW-UP ULTRASOUND. ADDITIONAL RECOMMENDATIONS PROVIDED ON THE MAMMOGRAPHY REPORT.       ASSESSMENT    IBIS: 28%    IMPRESSION :      ICD-10-CM    1. Abnormal mammogram of left breast R92.8 MRI BREAST BILATERAL W WO CONTRAST     External Referral to [facility] - [place], Jill Side, PhD, Psychology, [place]   2. Abnormal mammogram of right breast R92.8 MRI BREAST BILATERAL W WO CONTRAST     External Referral to [facility] - [place], Jill Side, PhD, Psychology, [place]   3. Fibrocystic breast changes of both breasts N60.11 MRI BREAST BILATERAL W WO CONTRAST    N60.12 External Referral to [facility] - [place] [place], Jill Side, PhD, Psychology, [place]   4. Dense breast R92.2 MRI BREAST BILATERAL W WO CONTRAST     External Referral to [facility] - [place], Jill Side, PhD, Psychology, [place]        PLAN:    I explained that breast pain is very rarely a symptom of malignancy.  I explained that about 50% of breast pain is idiopathic or related to hormones and that there is no effective treatment.  After obtaining normal findings on clinical and imaging studies, I reassured the patient. Data suggests that a simple assurance that the patient does not have breast cancer provides adequate relief for 78 to 85 percent of women. You may follow up in two to three months to exclude or treat recurrent/persistent pain. Of the remainder, some will get relief with evening primrose oil  supplements combined with complete caffeine cessation. I recommend a good supportive bra.  I recommended NSAID use as needed, however patient can not take it.  I recommend Evening Primrose Oil and vitamin E for the breast pain. I reviewed that the data did not show a significant benefit but have minimal side effects and might be worth while to try.  I recommend DECREASE CAFFEINE.       According to the NCCN guidelines we calculate the risk for developing breast cancer:  The Tyrer Cuzick 28.6% lifetime risk.    We discussed the high risk and its implications and the high risk management strategies which are:  1.  High risk screening with annual mammography and MRI of the breast six months apart along with six monthly clinical breast exams.  We discussed the sensitivity and specificity of each and the false positive rate for the MRI.    2.  Chemoprevention - not recommended due to multiple medical problems  3.  Bilateral prophylactic mastectomy will discuss at next appointment after genetic testing   4. Genetic counseling referral made    I recommend screening twice a year. The patient is in agreement. I will refer her to genetic counseling as well.    Bilateral MRI now  CBE 6 months  Bilateral Screening Mammogram 6 months          Orders Placed This Encounter   Procedures   ??? MRI  BREAST BILATERAL W WO CONTRAST     Standing Status:   Future     Standing Expiration Date:   02/27/2019   ??? External Referral to Genetics     Referral Priority:   Routine     Referral Type:   Eval and Treat     Referral Reason:   Specialty Services Required     Referred to Provider:   [facility]     Requested Specialty:   Genetics     Number of Visits Requested:   1   ??? [place] - [place], Jill Side, PhD, Psychology, [place]     Referral Priority:   Routine     Referral Type:   Eval and Treat     Referral Reason:   Specialty Services Required     Referred to Provider:   Marylen Ponto, PSY.D     Requested  Specialty:   Psychology     Number of Visits Requested:   1      No orders of the defined types were placed in this encounter.       Return in about 6 months (around 08/29/2018) for High Risk Follow Up.     Please send my note to PCP ask patient - Dr. Audrie Lia - [facility]      Izabell Schalk, MD    CC: Almyra Deforest, MD       This note was partially generated using Dragon voice recognition system, and there may be some incorrect words, spellings, punctuation that were not noticed in checking the note before saving.

## 2018-03-05 ENCOUNTER — Ambulatory Visit
Admit: 2018-03-05 | Discharge: 2018-03-05 | Payer: PRIVATE HEALTH INSURANCE | Attending: Clinical | Primary: Internal Medicine

## 2018-03-05 DIAGNOSIS — F411 Generalized anxiety disorder: Secondary | ICD-10-CM
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
## 2018-03-06 ENCOUNTER — Encounter

## 2018-03-06 ENCOUNTER — Inpatient Hospital Stay: Payer: PRIVATE HEALTH INSURANCE | Primary: Internal Medicine

## 2018-03-06 DIAGNOSIS — R928 Other abnormal and inconclusive findings on diagnostic imaging of breast: Secondary | ICD-10-CM

## 2018-03-14 MED ORDER — SODIUM CHLORIDE 0.9 % IJ SOLN
0.9 % | Freq: Once | INTRAMUSCULAR | Status: DC
Start: 2018-03-14 — End: 2018-03-17

## 2018-03-14 MED ORDER — GADOBENATE DIMEGLUMINE 529 MG/ML IV SOLN
529 MG/ML | Freq: Once | INTRAVENOUS | Status: AC | PRN
Start: 2018-03-14 — End: 2018-03-14
  Administered 2018-03-14: 16:00:00 20 mL via INTRAVENOUS

## 2018-03-14 MED FILL — SODIUM CHLORIDE 0.9 % IJ SOLN: 0.9 % | INTRAMUSCULAR | Qty: 40

## 2018-03-14 NOTE — Progress Notes (Signed)
Pt ambulated, with her cane, without incident, back to MRI female dressing room.

## 2018-03-14 NOTE — Progress Notes (Addendum)
Pt contacted Diagnostic Imaging check-in window concerned with recent IV site. Pt had MRI with contrast injection via 20G jelco needle and needle was removed immediately after contrast injection. Pt was discharged at 12:27pm with no c/o IV site. At this time, pt c/o site has a "knot forming" and "burning" at site. Assessment of left hand shows pt already removed dressing and small hematoma at site, with well defined edges, and no active bleeding, no redness, no warmth. Lindsay FriezeBrian S. RN that performed IV placement for MRI, with this RN to assess site as well. Pt offered to apply pressure at site with hand above heart level and ice pack. Pt refused and stated she didn't want any additional intervention, but wanted staff to be aware of her "reaction" to the IV. Pt offered reassurance and asked of any requests of pt and pt again denied. Pt left with her mother..Electronically signed by Lindsay DonIvy C Dustyn Armbrister, RN on 03/14/2018 at 12:40 PM

## 2018-03-15 ENCOUNTER — Telehealth

## 2018-03-15 NOTE — Telephone Encounter (Signed)
Notified the Patient of her benign Bilat Breast MRI results and the order from Dr. Allena KatzPatel to have the Bilat Breast MRI repeated in six months.The Patient verbalized understanding of the results and the order for a 6 month repeat MRI.

## 2018-06-30 ENCOUNTER — Encounter: Payer: Self-pay | Admitting: Medical Oncology

## 2018-06-30 ENCOUNTER — Emergency Department
Admission: EM | Admit: 2018-06-30 | Discharge: 2018-06-30 | Disposition: A | Discharge status: Home / Self Care | Payer: 59 | Attending: Emergency Medicine | Admitting: Emergency Medicine

## 2018-06-30 ENCOUNTER — Emergency Department: Payer: 59

## 2018-06-30 DIAGNOSIS — S93401A Sprain of unspecified ligament of right ankle, initial encounter: Secondary | ICD-10-CM | POA: Diagnosis not present

## 2018-06-30 DIAGNOSIS — Z9119 Patient's noncompliance with other medical treatment and regimen: Secondary | ICD-10-CM

## 2018-06-30 DIAGNOSIS — Y929 Unspecified place or not applicable: Secondary | ICD-10-CM | POA: Insufficient documentation

## 2018-06-30 DIAGNOSIS — Y33XXXA Other specified events, undetermined intent, initial encounter: Secondary | ICD-10-CM | POA: Insufficient documentation

## 2018-06-30 DIAGNOSIS — F17200 Nicotine dependence, unspecified, uncomplicated: Secondary | ICD-10-CM | POA: Diagnosis not present

## 2018-06-30 DIAGNOSIS — Y998 Other external cause status: Secondary | ICD-10-CM | POA: Diagnosis not present

## 2018-06-30 DIAGNOSIS — R030 Elevated blood-pressure reading, without diagnosis of hypertension: Secondary | ICD-10-CM

## 2018-06-30 DIAGNOSIS — I10 Essential (primary) hypertension: Secondary | ICD-10-CM | POA: Insufficient documentation

## 2018-06-30 DIAGNOSIS — Y939 Activity, unspecified: Secondary | ICD-10-CM | POA: Diagnosis not present

## 2018-06-30 DIAGNOSIS — Z91199 Patient's noncompliance with other medical treatment and regimen due to unspecified reason: Secondary | ICD-10-CM

## 2018-06-30 DIAGNOSIS — S99911A Unspecified injury of right ankle, initial encounter: Secondary | ICD-10-CM | POA: Diagnosis present

## 2018-06-30 MED ORDER — HYDROCODONE-ACETAMINOPHEN 5-325 MG PO TABS: 1.0000 | ORAL_TABLET | Freq: Four times a day (QID) | ORAL | 0 refills | Status: DC | PRN

## 2018-06-30 MED ORDER — HYDROCODONE-ACETAMINOPHEN 5-325 MG PO TABS
1.0000 | ORAL_TABLET | Freq: Once | ORAL | Status: AC
  Administered 2018-06-30: 1 via ORAL
  Filled 2018-06-30: qty 1

## 2018-06-30 NOTE — ED Triage Notes (Signed)
Pt reports injuring rt ankle last night.

## 2018-06-30 NOTE — ED Provider Notes (Signed)
[facility] Emergency Department Provider Note  ____________________________________________   First MD Initiated Contact with Patient 06/30/18 1307     (approximate)  I have reviewed the triage vital signs and the nursing notes.   HISTORY  Chief Complaint Ankle Pain   HPI Kellie West is a 37 y.o. female resents to the ED with complaint of right ankle pain.  Patient states that she twisted her ankle last evening and is continued to have pain since that time.  She is unable to bear weight.  She denies any previous injury to her ankle.  She has not taken any over-the-counter medication for this.  She denies any head injury or loss of consciousness during her fall last evening.   Past Medical History:  Diagnosis Date  . Hypertension     There are no active problems to display for this patient.   History reviewed. No pertinent surgical history.  Prior to Admission medications   Medication Sig Start Date End Date Taking? Authorizing Provider  HYDROcodone-acetaminophen (NORCO/VICODIN) 5-325 MG tablet Take 1 tablet by mouth every 6 (six) hours as needed for moderate pain. 06/30/18   Tommi Rumps, PA-C    Allergies Patient has no known allergies.  No family history on file.  Social History Social History   Tobacco Use  . Smoking status: Current Some Day Smoker  Substance Use Topics  . Alcohol use: No  . Drug use: Not on file    Review of Systems Constitutional: No fever/chills Eyes: No visual changes. ENT: No trauma. Cardiovascular: Denies chest pain. Respiratory: Denies shortness of breath. Gastrointestinal:   No nausea, no vomiting.  Musculoskeletal: Positive for right ankle pain. Skin: Negative for rash. Neurological: Negative for  focal weakness or numbness. ___________________________________________   PHYSICAL EXAM:  VITAL SIGNS: ED Triage Vitals  Enc Vitals Group     BP 06/30/18 1300 (!) 195/109     Pulse Rate  06/30/18 1300 (!) 112     Resp 06/30/18 1300 18     Temp 06/30/18 1300 98.4 F (36.9 C)     Temp Source 06/30/18 1300 Oral     SpO2 06/30/18 1300 98 %     Weight 06/30/18 1256 179 lb 14.3 oz (81.6 kg)     Height 06/30/18 1300 5\' 5"  (1.651 m)     Head Circumference --      Peak Flow --      Pain Score 06/30/18 1256 0     Pain Loc --      Pain Edu? --      Excl. in GC? --    Constitutional: Alert and oriented. Well appearing and in no acute distress. Eyes: Conjunctivae are normal.  Head: Atraumatic. Nose: No trauma. Neck: No stridor.   Cardiovascular: Normal rate, regular rhythm. Grossly normal heart sounds.  Good peripheral circulation. Respiratory: Normal respiratory effort.  No retractions. Lungs CTAB. Gastrointestinal: Soft and nontender. No distention.  Musculoskeletal: Examination of the right ankle there is moderate soft tissue swelling noted on the lateral aspect.  Range of motion is slightly restricted secondary to discomfort and patient is unable to bear weight secondary to pain.  No ecchymosis or abrasions were seen.  Motor or sensory function intact distal to the injury and pulses present.  Nontender knee and hip on exam. Neurologic:  Normal speech and language. No gross focal neurologic deficits are appreciated.  Skin:  Skin is warm, dry and intact.  No ecchymosis or abrasions were seen. Psychiatric: Mood  and affect are normal. Speech and behavior are normal.  ____________________________________________   LABS (all labs ordered are listed, but only abnormal results are displayed)  Labs Reviewed - No data to display RADIOLOGY  ED MD interpretation:   Right ankle x-ray is negative for acute bony injury.  Official radiology report(s): Dg Ankle Complete Right  Result Date: 06/30/2018 CLINICAL DATA:  Ankle pain EXAM: RIGHT ANKLE - COMPLETE 3+ VIEW COMPARISON:  None. FINDINGS: Mild soft tissue swelling at the right ankle. No fracture or dislocation. No radiopaque  foreign body. IMPRESSION: Mild right ankle soft tissue swelling without acute fracture or dislocation. Electronically Signed   By: Deatra Robinson M.D.   On: 06/30/2018 13:59    ____________________________________________   PROCEDURES  Procedure(s) performed:   .Splint Application Date/Time: 06/30/2018 5:08 PM Performed by: Baron Hamper, RN Authorized by: Tommi Rumps, PA-C   Consent:    Consent obtained:  Verbal   Consent given by:  Patient   Risks discussed:  Pain and swelling   Alternatives discussed:  Referral Pre-procedure details:    Sensation:  Normal Procedure details:    Laterality:  Right   Location:  Ankle   Ankle:  R ankle   Strapping: yes     Splint type:  Ankle stirrup   Supplies:  Prefabricated splint Post-procedure details:    Pain:  Improved   Sensation:  Normal   Patient tolerance of procedure:  Tolerated well, no immediate complications    Critical Care performed: No  ____________________________________________   INITIAL IMPRESSION / ASSESSMENT AND PLAN / ED COURSE  As part of my medical decision making, I reviewed the following data within the electronic MEDICAL RECORD NUMBER Notes from prior ED visits and [place] Controlled Substance Database  Patient presents to the ED with complaint of right ankle sprain when she fell last evening.  She is continued to have pain and swelling and unable to bear weight.  She denies any previous injury to her ankle.  Soft tissue edema is appreciated and x-ray is negative for acute bony injury.  Patient was placed in a stirrup ankle splint and given crutches.  She is instructed to ice and elevate to reduce swelling.  She is also given a prescription for Norco 1 every 6 hours as needed for pain.  She is also instructed to take ibuprofen as needed.  When asked about her blood pressure she states that she occasionally takes her blood pressure medication and currently has not taken her blood pressure medication in the last 4  to 5 days.  She is strongly encouraged to take her medication and was reminded what her blood pressure was at the time of her discharge.  She is to follow-up with her PCP if any continued problems and also to have her blood pressure rechecked. ____________________________________________   FINAL CLINICAL IMPRESSION(S) / ED DIAGNOSES  Final diagnoses:  Sprain of right ankle, unspecified ligament, initial encounter  Elevated blood pressure reading  Medically noncompliant     ED Discharge Orders         Ordered    HYDROcodone-acetaminophen (NORCO/VICODIN) 5-325 MG tablet  Every 6 hours PRN     06/30/18 1449           Note:  This document was prepared using Dragon voice recognition software and may include unintentional dictation errors.    Tommi Rumps, PA-C 06/30/18 1711    Arnaldo Natal, MD 07/05/18 1455

## 2018-06-30 NOTE — ED Notes (Signed)
Last night pt stepped on an empty bottle  Which made pt lose her balance  And fell  And may have twisted or turned it. Pt unable to bear weight swollen and painful to touch

## 2018-06-30 NOTE — Discharge Instructions (Addendum)
Follow-up with your primary care provider or Dr. Ether Griffins who is on-call for podiatry at [facility] if any continued problems.  Ice and elevation for your ankle to reduce swelling.  Wear splint for added support and protection.  Use crutches for walking.  You may take ibuprofen as needed for inflammation.  Norco is for moderate to severe pain.  This medication could cause drowsiness and increase your risk for injury.  Do not drive while taking this medication. Also take your blood pressure medication every day.  Your blood pressure at the time of discharge is 183/107

## 2018-08-19 ENCOUNTER — Inpatient Hospital Stay: Payer: PRIVATE HEALTH INSURANCE | Primary: Internal Medicine

## 2018-08-19 DIAGNOSIS — R928 Other abnormal and inconclusive findings on diagnostic imaging of breast: Secondary | ICD-10-CM

## 2018-08-27 ENCOUNTER — Ambulatory Visit
Admit: 2018-08-27 | Discharge: 2018-08-27 | Payer: PRIVATE HEALTH INSURANCE | Attending: Surgery | Primary: Internal Medicine

## 2018-08-27 DIAGNOSIS — R928 Other abnormal and inconclusive findings on diagnostic imaging of breast: Secondary | ICD-10-CM

## 2018-08-27 NOTE — Patient Instructions (Signed)
Patient Education        Breast Lumps (Noncancerous): Care Instructions  Your Care Instructions  Breast lumps or changes are a common health worry for most women. Women may have many kinds of breast lumps and other breast changes throughout their lives, including ones that occur with menstrual periods, pregnancy, and aging. Most breast lumps are harmless and are not cancer.  Many women's breasts feel lumpy and tender before their menstrual periods. Women also may have lumps when they are breastfeeding. Breast lumps may go away after menopause.  Common noncancerous breast lumps include:  ?? Cysts, or sacs filled with fluid.  ?? Fatty lumps, which may be firm. These may or may not cause pain.  ?? Fibroadenomas, growths that are round and firm with smooth edges.  ?? Abscesses, which are pockets of infection within the breast.  Follow-up care is a key part of your treatment and safety. Be sure to make and go to all appointments, and call your doctor if you are having problems. It's also a good idea to know your test results and keep a list of the medicines you take.  How can you care for yourself at home?  ?? Take an over-the-counter pain medicine, such as acetaminophen (Tylenol), ibuprofen (Advil, Motrin), or naproxen (Aleve). Read and follow all instructions on the label.  ?? Do not take two or more pain medicines at the same time unless the doctor told you to. Many pain medicines have acetaminophen, which is Tylenol. Too much acetaminophen (Tylenol) can be harmful.  ?? If you are pregnant, do not take any medicine other than acetaminophen unless your doctor has told you to.  ?? Wear a supportive bra, such as a sports bra or jog bra.  ?? You may want to limit caffeine. Some women say that cutting back on caffeine reduces their breast tenderness.  ?? A diet very low in fat (about 15% of daily diet) may reduce breast tenderness. Talk to your doctor about whether you should try a very low-fat diet.  ?? A diet low in salt (sodium)  also may reduce breast tenderness.  When should you call for help?  Watch closely for changes in your health, and be sure to contact your doctor if you:  ?? ?? You do not get better as expected.   ?? ?? Your breast has changed.   ?? ?? You have pain in your breast.   ?? ?? You have a discharge from your nipple.   ?? ?? A breast lump changes or does not go away.   Where can you learn more?  Go to [email or web address] and sign in to your MyChart account. Enter B530 in the Search Health Information box to learn more about "Breast Lumps (Noncancerous): Care Instructions."     If you do not have an account, please click on the "Sign Up Now" link.  Current as of: November 20, 2017  Content Version: 12.1  ?? 2006-2019 [employer]. Care instructions adapted under license by [facility]. If you have questions about a medical condition or this instruction, always ask your healthcare professional. [employer] disclaims any warranty or liability for your use of this information.         Patient Education        Fibrocystic Breast Changes: Care Instructions  Your Care Instructions  Fibrocystic breast changes cause many small lumps to form in your breast. Some areas of your breast may feel thicker or denser than other areas. Your breasts also   may feel sore or tender.  You may notice lumps in both breasts around the nipple and in the upper, outer part of the breasts, especially before your menstrual period. The lumps may come and go and change size in just a few days.  Fibrocystic breast changes are normal and are not cancer. Treatment is not usually needed. If you have a hard, grainy lump, unusual pain, or nipple discharge, your doctor may order tests to look for a more serious problem. Talk to your doctor about the need for regular mammograms.  Follow-up care is a key part of your treatment and safety. Be sure to make and go to all appointments, and call your doctor if you are having problems.  It's also a good idea to know your test results and keep a list of the medicines you take.  How can you care for yourself at home?  ?? Take an over-the-counter pain medicine, such as acetaminophen (Tylenol), ibuprofen (Advil, Motrin), or naproxen (Aleve). Read and follow all instructions on the label.  ?? Do not take two or more pain medicines at the same time unless the doctor told you to. Many pain medicines have acetaminophen, which is Tylenol. Too much acetaminophen (Tylenol) can be harmful.  ?? Wear a supportive bra, such as a sports bra or jog bra.  ?? You may want to limit caffeine. Some women say that cutting back on caffeine reduces breast tenderness.  ?? A diet very low in fat (about 15% of daily diet) may reduce breast tenderness. Talk to your doctor about whether you should try a very low-fat diet.  When should you call for help?  Watch closely for changes in your health, and be sure to contact your doctor if:  ?? ?? You do not get better as expected.   ?? ?? Your breast has changed.   ?? ?? You have pain in your breast.   ?? ?? You have a discharge from your nipple.   ?? ?? A breast lump changes or does not go away.   Where can you learn more?  Go to [email or web address] and sign in to your MyChart account. Enter J802 in the Search Health Information box to learn more about "Fibrocystic Breast Changes: Care Instructions."     If you do not have an account, please click on the "Sign Up Now" link.  Current as of: November 20, 2017  Content Version: 12.1  ?? 2006-2019 [employer]. Care instructions adapted under license by [facility]. If you have questions about a medical condition or this instruction, always ask your healthcare professional. [employer] disclaims any warranty or liability for your use of this information.

## 2018-08-27 NOTE — Progress Notes (Signed)
FOLLOW UP NOTE    PATIENT:   Lindsay Lawson    DATE:      08/27/18    HISTORY AND CHIEF COMPLAINT:    Lindsay Lawson  is a 37 y.o.  year old Caucasian female who presents for a follow up of fibrocystic changes and high risk and recent abnormal MRI       PHYSICAL EXAMINATION:    Vitals:    08/27/18 1002   BP: 112/78   Pulse: 64   Resp: 18      Lindsay Lawson is a 38 y.o.  year old pleasant Caucasian  female in no apparent distress.  The patient was examined in the seated and supine position with the motion of the pectoralis muscles bilaterally.  There are no masses visualized in the neck.  There is no cervical, supraclavicular or axillary lymphadenopathy noted bilaterally.  Bilateral breasts are symmetrical. Bilateral nipples are everted.  No dimpling, indentation, nipple discharge or nipple retraction noted bilaterally.  After detailed breast examination no palpable abnormalities or concerning thickening are noted bilaterally.  She is alert, oriented, normal speech, and she walks with a cane. The area of concern in left breast cysts.    IMAGING:    The imaging was reviewed. Reports in epic     Ultrasound of the Left Breast, Limited    PATIENT:  Lindsay Lawson     DATE: 08/27/2018    DOB:      1981/09/07     INDICATION:  Left Breast Mass    LOCATION:   9 o'clock 7 cm from nipple    DESCRIPTION:    The ultrasound probe was placed over the area of concern. Small anechoic masses with smooth borders, without edge shadowing, without posterior shadowing and wider that tall    IMPRESSION: Left breast ultrasound demonstrates a simple cyst      Category 2    Dictated by:  Rosalva Ferron, MD, FACS 08/27/2018     Assessment     IMPRESSION:      ICD-10-CM    1. Abnormal MRI, breast R92.8 MRI BREAST BILATERAL W WO CONTRAST   2. Abnormal mammogram of left breast R92.8    3. At high risk for breast cancer Z91.89         PLAN:    Reassured the patient. Area is benign. She has fibrocystic changes. Will see her back in 6  months  Recommend repeat MRI. The patient stated understanding  Dictated by Liliane Channel, MD  08/27/18     This note was partially generated using Dragon voice recognition system, and there may be some incorrect words, spellings, punctuation that were not noticed in checking the note before saving.

## 2018-09-18 ENCOUNTER — Encounter

## 2018-09-18 ENCOUNTER — Inpatient Hospital Stay: Admit: 2018-09-18 | Payer: PRIVATE HEALTH INSURANCE | Primary: Internal Medicine

## 2018-09-18 DIAGNOSIS — R928 Other abnormal and inconclusive findings on diagnostic imaging of breast: Secondary | ICD-10-CM

## 2018-09-18 MED ORDER — SODIUM CHLORIDE (PF) 0.9 % IJ SOLN
0.9 % | Freq: Once | INTRAMUSCULAR | Status: DC
Start: 2018-09-18 — End: 2018-09-21

## 2018-09-18 MED ORDER — GADOBENATE DIMEGLUMINE 529 MG/ML IV SOLN
529 MG/ML | Freq: Once | INTRAVENOUS | Status: AC | PRN
Start: 2018-09-18 — End: 2018-09-18
  Administered 2018-09-18: 17:00:00 20 mL via INTRAVENOUS

## 2018-09-18 MED FILL — SODIUM CHLORIDE (PF) 0.9 % IJ SOLN: 0.9 % | INTRAMUSCULAR | Qty: 40

## 2018-09-23 ENCOUNTER — Encounter

## 2019-02-21 ENCOUNTER — Inpatient Hospital Stay: Admit: 2019-02-21 | Payer: PRIVATE HEALTH INSURANCE | Primary: Internal Medicine

## 2019-02-21 ENCOUNTER — Encounter

## 2019-02-21 DIAGNOSIS — R928 Other abnormal and inconclusive findings on diagnostic imaging of breast: Secondary | ICD-10-CM

## 2019-02-25 ENCOUNTER — Ambulatory Visit
Admit: 2019-02-25 | Discharge: 2019-02-25 | Payer: PRIVATE HEALTH INSURANCE | Attending: Surgery | Primary: Internal Medicine

## 2019-02-25 DIAGNOSIS — R928 Other abnormal and inconclusive findings on diagnostic imaging of breast: Secondary | ICD-10-CM

## 2019-02-25 NOTE — Progress Notes (Signed)
FOLLOW UP NOTE    PATIENT:   Lindsay Lawson    DATE:      02/25/19    HISTORY AND CHIEF COMPLAINT:    ABBIEGALE Lawson  is a 38 y.o.  year old Caucasian female who presents for a follow up of high risk screening.  She is well and has breast pain that is tolerable.  Her COVID screening showed she was afebrile  She does perform self breast exams.  She denies any masses, skin changes, nipple discharge, nipple retraction, or recent breast trauma  .    PHYSICAL EXAMINATION:    Vitals:    02/25/19 1031   BP: 92/70        GAZELLE Lawson is a 38 y.o.  year old pleasant Caucasian  female in no apparent distress.  The patient was examined in the seated and supine position with the motion of the pectoralis muscles bilaterally.  There are no masses visualized in the neck.  There is no cervical, supraclavicular or axillary lymphadenopathy noted bilaterally.  Bilateral breasts are symmetrical. Bilateral nipples are everted.  No dimpling, indentation, nipple discharge or nipple retraction noted bilaterally.  After detailed breast examination no palpable abnormalities or concerning thickening are noted bilaterally.  She is alert, oriented, normal speech, and uses a cane to ambulate.  IMAGING:    The imaging was reviewed.   Mam Digital Diagnostic W Or Wo Cad Bilateral    Result Date: 02/21/2019  BILATERAL DIGITAL DIAGNOSTIC MAMMOGRAM AND FOCUSED BILATERAL BREAST SONOGRAM: COMPARISONS: Dating back to 02/27/2017 CLINICAL HISTORY:  Follow-up 2 densities within each breast. FINDINGS: Routine two view mammography was performed and compared to previous study.    3-D Tomosynthesis images were obtained. There is heterogeneously dense breast parenchyma, which can obscure small breast lesions.  No new masses or suspicious microcalcifications are visualized to suggest malignancy. No evidence of skin thickening. Right breast: Within the lateral aspect of the breast at the 8:00 position there is asymmetric density which was seen previously and  similar to 02/27/2017. A focused sonogram was obtained. At the 8:00 position and 4 cm from nipple there is a complex cystic lesion with septations which measures approximately 0.5 x 1.15 x 0.32 cm. This appears similar to prior exam. This is probably benign. Previously there was a density in the upper breast which is no longer definitely identified. Left breast: At the 12:00 position and 6 from the nipple on the MLO view there is a focal asymmetric density which was seen previously. It has the same configuration as the prior exams. On focused sonogram at the 12:00 position and 5 cm nipple is a complex cystic area which measures approximately 0.9 x 0.5 x 0.9 cm. This appears similar to prior exam. Also within the medial aspect the breast there was a asymmetric density posteriorly which were seen previously and is slightly less pronounced. Patient had a palpable adenopathy 3:00 position and 4 cm from nipple. On focused sonogram there is a cyst at this location which measures 0.6 x 0.3 x 0.5 cm. CAD analysis was performed and used in the interpretation.     BI-RADS 3: PROBABLY BENIGN--SHORT INTERVAL FOLLOW-UP SUGGESTED. NO SIGNIFICANT CHANGE. BREAST DENSITY: HETEROGENEOUSLY DENSE. RECOMMEND FOLLOW-UP MAMMOGRAM IN 6-12 MONTHS. FIBROCYSTIC CHANGES. Board Certified Radiologists.  Accredited by the ACR and FDA. MAMMOGRAPHY IS VERY IMPORTANT TO YOUR HEALTH.  THE AMERICAN CANCER SOCIETY GUIDELINES RECOMMEND THAT WOMEN 23 YEARS OF AGE AND OLDER SHOULD HAVE A MAMMOGRAM EVERY YEAR. A REMINDER LETTER  WILL BE SENT AT THE APPROPRIATE TIME.      [place] Breast Limited Left    Result Date: 02/21/2019  BILATERAL DIGITAL DIAGNOSTIC MAMMOGRAM AND FOCUSED BILATERAL BREAST SONOGRAM: COMPARISONS: Dating back to 02/27/2017 CLINICAL HISTORY:  Follow-up 2 densities within each breast. FINDINGS: Routine two view mammography was performed and compared to previous study.    3-D Tomosynthesis images were obtained. There is heterogeneously dense breast  parenchyma, which can obscure small breast lesions.  No new masses or suspicious microcalcifications are visualized to suggest malignancy. No evidence of skin thickening. Right breast: Within the lateral aspect of the breast at the 8:00 position there is asymmetric density which was seen previously and similar to 02/27/2017. A focused sonogram was obtained. At the 8:00 position and 4 cm from nipple there is a complex cystic lesion with septations which measures approximately 0.5 x 1.15 x 0.32 cm. This appears similar to prior exam. This is probably benign. Previously there was a density in the upper breast which is no longer definitely identified. Left breast: At the 12:00 position and 6 from the nipple on the MLO view there is a focal asymmetric density which was seen previously. It has the same configuration as the prior exams. On focused sonogram at the 12:00 position and 5 cm nipple is a complex cystic area which measures approximately 0.9 x 0.5 x 0.9 cm. This appears similar to prior exam. Also within the medial aspect the breast there was a asymmetric density posteriorly which were seen previously and is slightly less pronounced. Patient had a palpable adenopathy 3:00 position and 4 cm from nipple. On focused sonogram there is a cyst at this location which measures 0.6 x 0.3 x 0.5 cm. CAD analysis was performed and used in the interpretation.     BI-RADS 3: PROBABLY BENIGN--SHORT INTERVAL FOLLOW-UP SUGGESTED. NO SIGNIFICANT CHANGE. BREAST DENSITY: HETEROGENEOUSLY DENSE. RECOMMEND FOLLOW-UP MAMMOGRAM IN 6-12 MONTHS. FIBROCYSTIC CHANGES. Board Certified Radiologists.  Accredited by the ACR and FDA. MAMMOGRAPHY IS VERY IMPORTANT TO YOUR HEALTH.  THE AMERICAN CANCER SOCIETY GUIDELINES RECOMMEND THAT WOMEN 39 YEARS OF AGE AND OLDER SHOULD HAVE A MAMMOGRAM EVERY YEAR. A REMINDER LETTER WILL BE SENT AT THE APPROPRIATE TIME.      [place] Breast Limited Right    Result Date: 02/21/2019  BILATERAL DIGITAL DIAGNOSTIC MAMMOGRAM  AND FOCUSED BILATERAL BREAST SONOGRAM: COMPARISONS: Dating back to 02/27/2017 CLINICAL HISTORY:  Follow-up 2 densities within each breast. FINDINGS: Routine two view mammography was performed and compared to previous study.    3-D Tomosynthesis images were obtained. There is heterogeneously dense breast parenchyma, which can obscure small breast lesions.  No new masses or suspicious microcalcifications are visualized to suggest malignancy. No evidence of skin thickening. Right breast: Within the lateral aspect of the breast at the 8:00 position there is asymmetric density which was seen previously and similar to 02/27/2017. A focused sonogram was obtained. At the 8:00 position and 4 cm from nipple there is a complex cystic lesion with septations which measures approximately 0.5 x 1.15 x 0.32 cm. This appears similar to prior exam. This is probably benign. Previously there was a density in the upper breast which is no longer definitely identified. Left breast: At the 12:00 position and 6 from the nipple on the MLO view there is a focal asymmetric density which was seen previously. It has the same configuration as the prior exams. On focused sonogram at the 12:00 position and 5 cm nipple is a complex cystic area which measures approximately  0.9 x 0.5 x 0.9 cm. This appears similar to prior exam. Also within the medial aspect the breast there was a asymmetric density posteriorly which were seen previously and is slightly less pronounced. Patient had a palpable adenopathy 3:00 position and 4 cm from nipple. On focused sonogram there is a cyst at this location which measures 0.6 x 0.3 x 0.5 cm. CAD analysis was performed and used in the interpretation.     BI-RADS 3: PROBABLY BENIGN--SHORT INTERVAL FOLLOW-UP SUGGESTED. NO SIGNIFICANT CHANGE. BREAST DENSITY: HETEROGENEOUSLY DENSE. RECOMMEND FOLLOW-UP MAMMOGRAM IN 6-12 MONTHS. FIBROCYSTIC CHANGES. Board Certified Radiologists.  Accredited by the ACR and FDA. MAMMOGRAPHY IS  VERY IMPORTANT TO YOUR HEALTH.  THE AMERICAN CANCER SOCIETY GUIDELINES RECOMMEND THAT WOMEN 42 YEARS OF AGE AND OLDER SHOULD HAVE A MAMMOGRAM EVERY YEAR. A REMINDER LETTER WILL BE SENT AT THE APPROPRIATE TIME.        Assessment     IMPRESSION:      ICD-10-CM    1. Abnormal mammogram of left breast R92.8 MAM DIGITAL DIAGNOSTIC W OR WO CAD BILATERAL   2. Abnormal mammogram of right breast R92.8 MAM DIGITAL DIAGNOSTIC W OR WO CAD BILATERAL        PLAN:    Patient is doing well.  Recommend clinical breast exams in the breast surgical suite in 6 month(s)  Mammography in 6 month(s)    Total face to face time was 15 minutes with greater than 50% spent on counseling the patient or coordinating her care.     Dictated by Liliane Channel, MD  02/25/19     This note was partially generated using Dragon voice recognition system, and there may be some incorrect words, spellings, punctuation that were not noticed in checking the note before saving.

## 2019-04-24 IMAGING — DX DG ANKLE COMPLETE 3+V*R*
3 series · 3 of 3 positions shown · non-contrast
Comparison: None.

CLINICAL DATA: Ankle pain

EXAM:
RIGHT ANKLE - COMPLETE 3+ VIEW

[ankle ap]
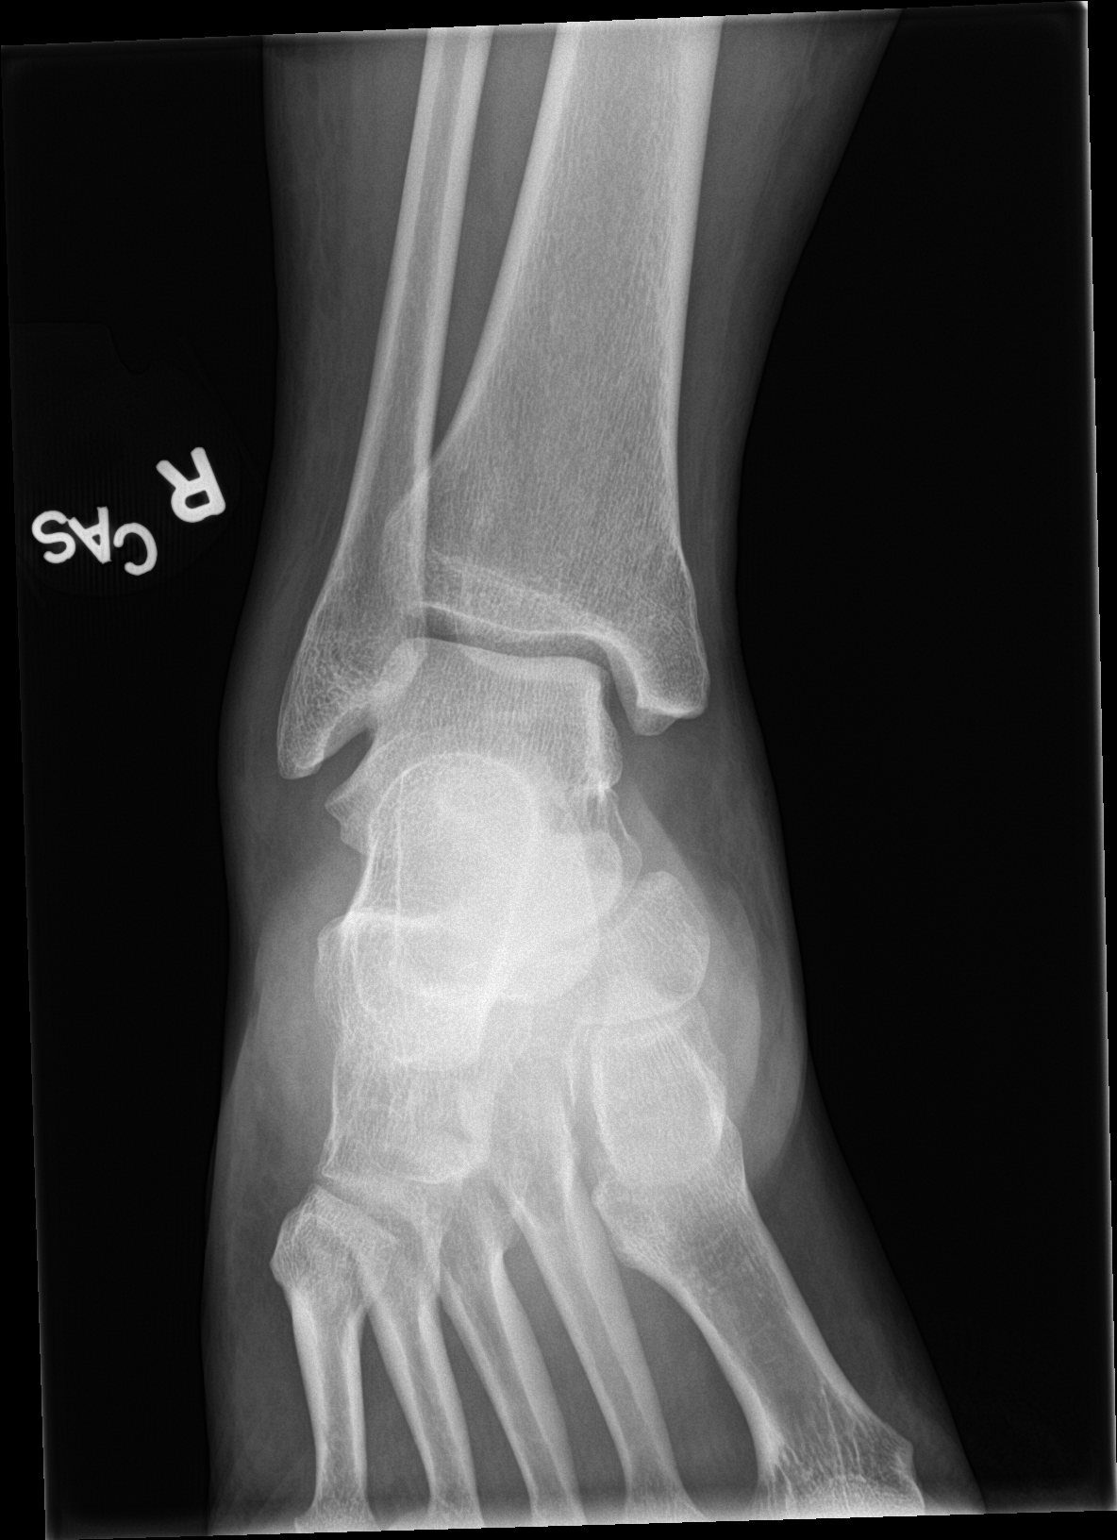

[ankle obl]
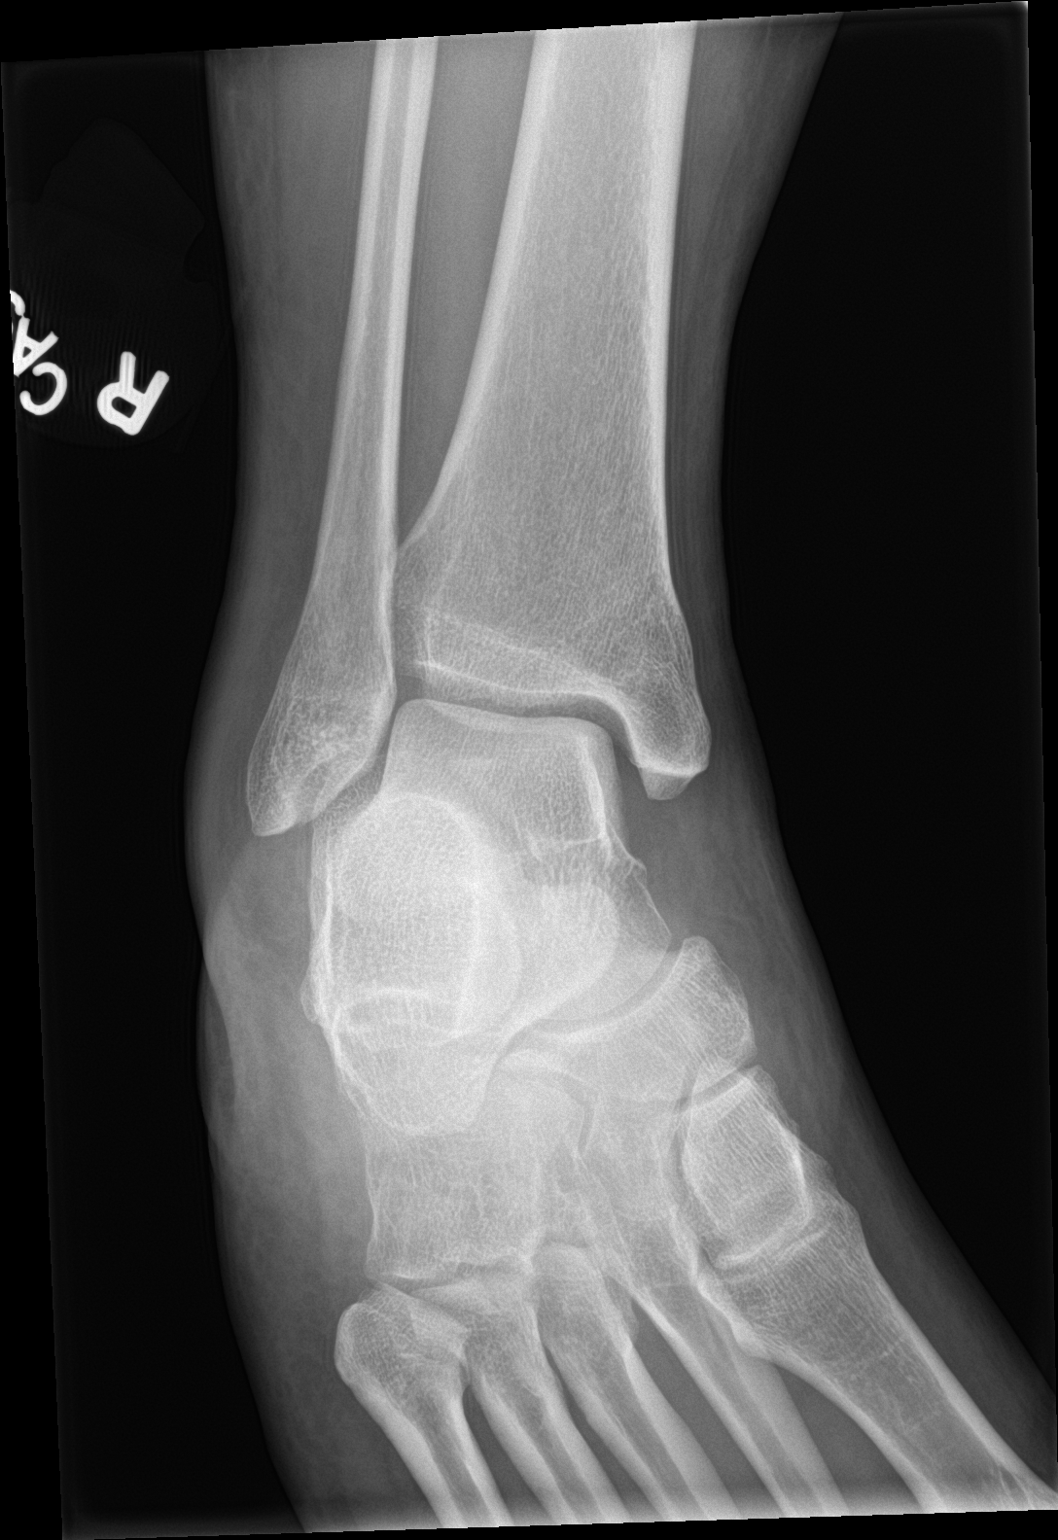

[ankle lat]
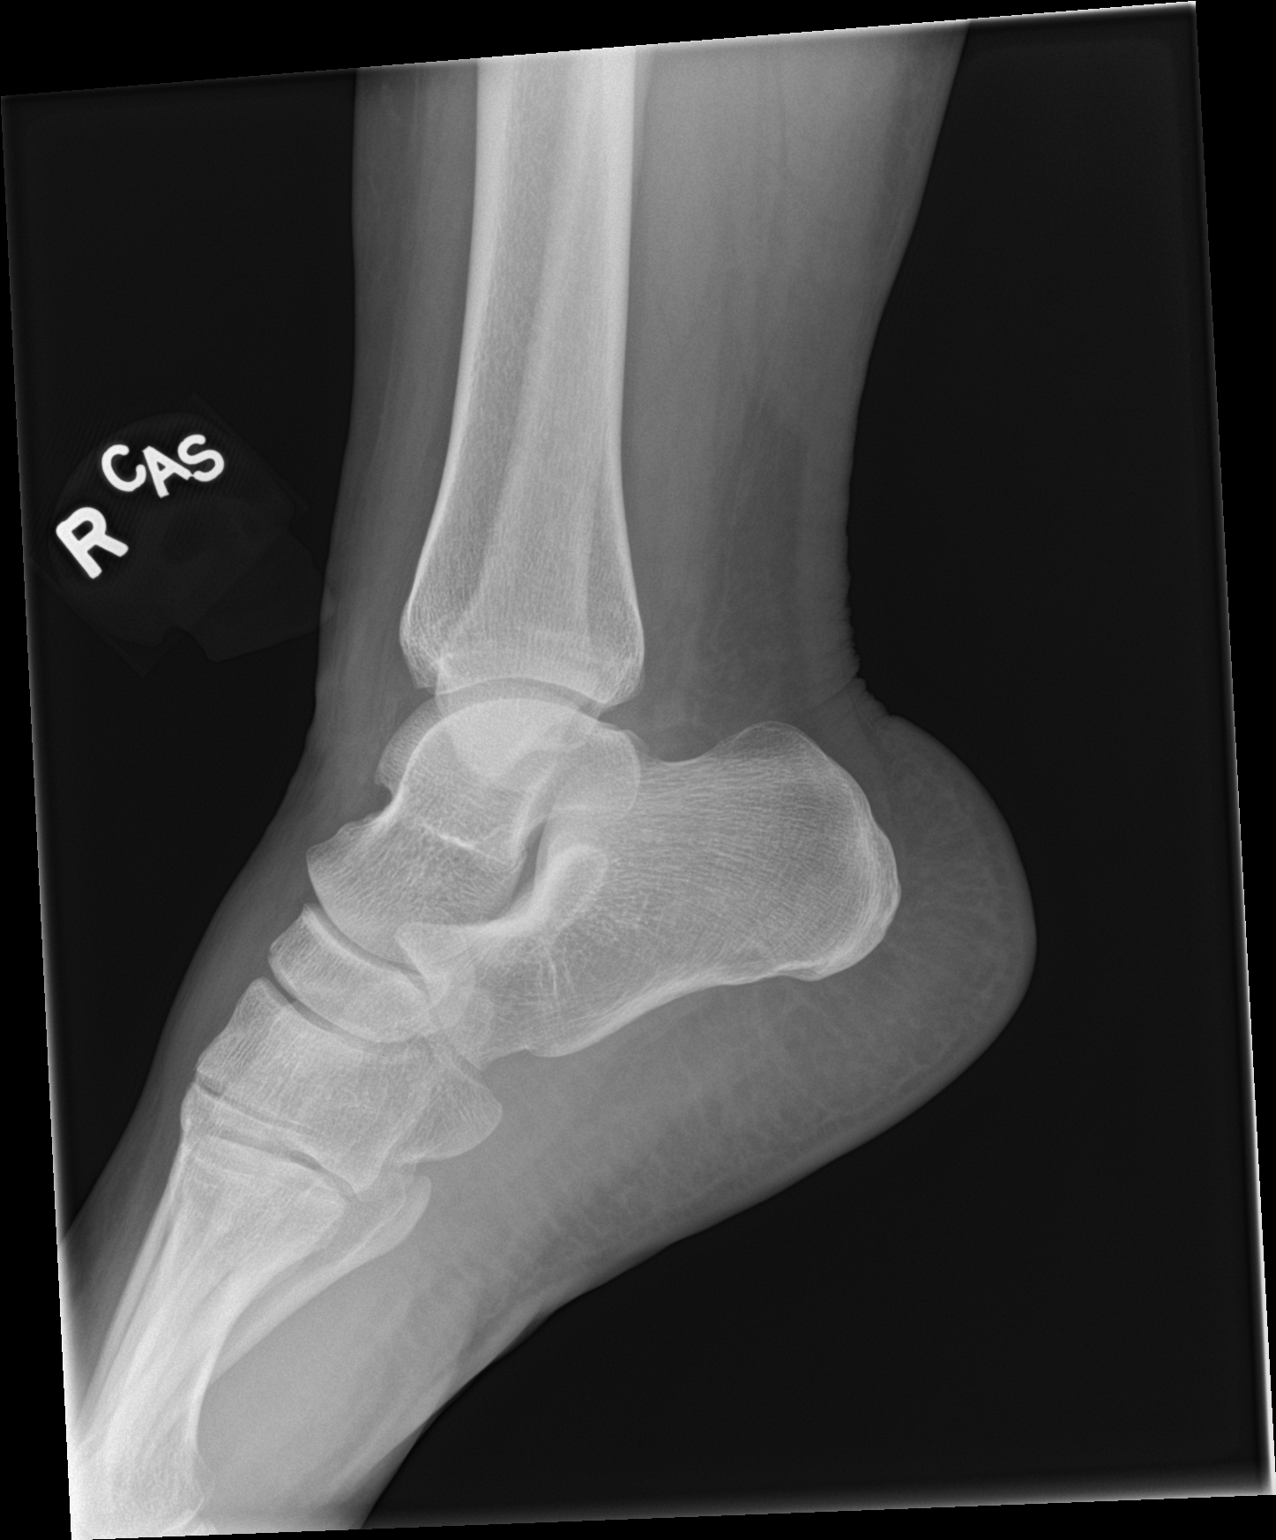

[3 of 3 positions shown; findings below may reference images not displayed]

FINDINGS: Mild soft tissue swelling at the right ankle. No fracture or
dislocation. No radiopaque foreign body.
IMPRESSION: Mild right ankle soft tissue swelling without acute fracture or
dislocation.

## 2019-07-04 NOTE — Progress Notes (Signed)
Formatting of this note is different from the original.  Images from the original note were not included.    Lindsay, Lawson DOB: 1980-12-12 (38 yo F) Acc No. [id number] DOS: 07/04/2019    Lindsay Lawson    38 Y old Female,DOB: March 27, 1981    Account [id number]    [address] [place], [place]    Home: [phone]     Guarantor:Lindsay Lawson A Insurance:[employer]   QIH:[id number] NOMS Referring:Lindsay Lawson   Appointment Facility:[facility]     07/04/2019 Lindsay Cirri, MD     Reason for Appointment   1.Fu/us       History of Present Illness   Constitutional:    PERTINENT LAB/RADIOLOGY RESULTS AS AFFIXED TO CHART BELOW ARE DW PT AT LENGTH AND ALL HEALTH IMPLICATIONS THEREOF. MANAGEMENT OPTIONS AND THEIR RBAI ARE ALSO DW PT AS WELL AS ALL ?S ANSWERED AT LENGTH.     Current Medications   Taking  Citalopram Hydrobromide 40 MG Tablet 0.5 tablet Orally Once a day   Baclofen 10 MG Tablet 1 tablet with food or milk Orally Three times a day   Methotrexate Sodium (PF) 50 MG/2ML Solution as directed Injection once a week   Pregabalin 100 MG Capsule 1 capsule Orally Once a day   Alprazolam 0.5 MG Tablet 1 tablet Orally Twice a day   Ondansetron 4 MG Tablet Disintegrating 1 tablet on the tongue and allow to dissolve Orally Once a day   Folic Acid 1 MG Tablet 1 tablet Orally Once a day   PredniSONE 5 MG Tablet 1 tablet Orally Once a day   Leflunomide 20 MG Tablet 1 tablet Orally Once a day   Imitrex(SUMAtriptan) 100 MG Tablet 1 tablet at least 2 hours between doses as needed Orally Twice a day   Synthroid(L-Thyroxine Sodium) 100 MCG Tablet 1 tablet in the morning on an empty stomach Orally Once a day   Glycopyrrolate 2 MG Tablet 1 tablet Orally Once a day   Dicyclomine HCl 20 MG Tablet 1 tablet Orally Three times a day   Omeprazole 40 MG Capsule Delayed Release 1 capsule 30 minutes before morning meal Orally Once a day   Cholecalciferol 125 MCG (5000 UT) Capsule as directed Orally    Mirtazapine 30 MG  Tablet 1 tablet at bedtime Orally Once a day   Amitriptyline HCl 10 MG Tablet 1 tablet at bedtime Orally Once a day   Leucovorin Calcium 5 MG Tablet 1 tablet Orally    Clindamycin Phosphate 2 % Cream 1 applicatorful at bedtime Vaginal Once a day           Dermatology Examination:             Assessments     1. Frequency of micturition - R35.0   2. Pelvic pain in female - 625.9                 Electronically signed by Lindsay Lawson , MD on 07/19/2019 at 08:44 PM EDT   Sign off status: Completed       [facility] [id number]  [place] [address]  IRJ:[phone]  Fax:[phone]         Progress Note:Lindsay Lawson, MD10/11/2018     Note generated by eClinicalWorks EMR/PM Software ([email or web address])       Electronically signed by Interface, Transcription Conversion at 04/09/2022  3:22 PM EDT

## 2019-07-11 MED FILL — SULFAMETHOXAZOLE-TRIMETHOPRIM 800-160 MG OR TABS: 800-160 MG | ORAL | 5 days supply | Qty: 10 | Fill #0

## 2019-07-11 MED FILL — CYCLOPHOSPHAMIDE 1 G IJ SOLR: 1 GM | Injection | 6 days supply | Qty: 21 | Fill #0

## 2019-07-11 MED FILL — HEPARIN LOCK FLUSH 100 UNIT/ML IV SOLN: 100 UNIT/ML | IntraVENous | 7 days supply | Qty: 21 | Fill #0

## 2019-07-28 MED FILL — CEFAZOLIN SODIUM 1 G IJ SOLR: 1 g | Injection | 4 days supply | Qty: 11 | Fill #0

## 2019-07-28 MED FILL — ONDANSETRON 4 MG OR TBDP: 4 MG | ORAL | 7 days supply | Qty: 21 | Fill #0

## 2019-07-28 MED FILL — DOK 100 MG PO CAPS: 100 MG | Oral | 5 days supply | Qty: 19 | Fill #0

## 2019-09-05 ENCOUNTER — Ambulatory Visit
Admit: 2019-09-05 | Discharge: 2019-09-05 | Payer: PRIVATE HEALTH INSURANCE | Attending: Surgery | Primary: Internal Medicine

## 2019-09-05 DIAGNOSIS — R922 Inconclusive mammogram: Secondary | ICD-10-CM

## 2019-09-05 NOTE — Progress Notes (Signed)
FOLLOW UP NOTE    PATIENT:   Lindsay Lawson    DATE:      09/05/19    HISTORY AND CHIEF COMPLAINT:    Lindsay Lawson  is a 38 y.o.  year old Caucasian female who presents for a follow up of high risk screening. 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  She denies any breast pain (except known cysts), masses, skin changes, nipple discharge, nipple retraction, or recent breast trauma  .    PHYSICAL EXAMINATION:    Vitals:    09/05/19 1131   BP: 122/72   Pulse: 72   Temp: 97.2 ??F (36.2 ??C)   SpO2: 98%       Lindsay Lawson is a 38 y.o.  year old pleasant Caucasian  female in no distress but uses cane.  The patient was examined in the seated and supine position with the motion of the pectoralis muscles bilaterally.  There are no masses visualized in the neck.  There is no cervical, supraclavicular or axillary lymphadenopathy noted bilaterally.  Bilateral breasts are symmetrical. Bilateral nipples are everted.  No dimpling, indentation, nipple discharge or nipple retraction noted bilaterally.  After detailed breast examination no palpable abnormalities or concerning thickening are noted bilaterally. She has breast masses bilaterally but unchanged, consistent with fibrocystic changes. She is alert, oriented, normal speech, no focal findings or movement disorder noted    IMAGING:    She does not want the MRI    Assessment     IMPRESSION:      ICD-10-CM    1. Dense breast  R92.2         PLAN:    Patient is doing well.  Recommend clinical breast exams in the breast surgical suite in 6 month(s)  Mammography in 6 month(s)  Recommend an active lifestyle, healthy diet, limited alcohol intake, achieve and maintain a healthy BMI to optimize breast cancer outcomes / decrease risk of breast cancer.    Total face to face time was 15 minutes with greater than 50% spent on counseling the patient or coordinating her care.     Dictated by Carolee Rota, MD  09/05/19     This note  was partially generated using Dragon voice recognition system, and there may be some incorrect words, spellings, punctuation that were not noticed in checking the note before saving.

## 2019-09-05 NOTE — Progress Notes (Signed)
The patient was asked if she would like a chaperone present for her intimate exam. She  Declined the chaperone. Adrianah Prophete

## 2020-03-05 ENCOUNTER — Encounter: Attending: Surgery | Primary: Internal Medicine

## 2020-03-05 NOTE — Telephone Encounter (Signed)
Pt cancelled her appt for 03/05/20 because she is hospitalized for a kidney infection at [facility]. Dr asked that [facility] follow up and reschedule pt when she is able to come back in.

## 2020-03-09 NOTE — Telephone Encounter (Signed)
Patient is rescheduled for a future appointment.

## 2020-04-06 ENCOUNTER — Encounter: Attending: Surgery | Primary: Internal Medicine

## 2020-04-20 ENCOUNTER — Inpatient Hospital Stay: Admit: 2020-04-20 | Payer: PRIVATE HEALTH INSURANCE | Primary: Internal Medicine

## 2020-04-20 ENCOUNTER — Encounter

## 2020-04-20 DIAGNOSIS — R922 Inconclusive mammogram: Secondary | ICD-10-CM

## 2020-04-20 DIAGNOSIS — R928 Other abnormal and inconclusive findings on diagnostic imaging of breast: Secondary | ICD-10-CM

## 2020-04-23 ENCOUNTER — Ambulatory Visit
Admit: 2020-04-23 | Discharge: 2020-04-23 | Payer: PRIVATE HEALTH INSURANCE | Attending: Surgery | Primary: Internal Medicine

## 2020-04-23 DIAGNOSIS — N6012 Diffuse cystic mastopathy of left breast: Secondary | ICD-10-CM

## 2020-04-23 NOTE — Progress Notes (Signed)
FOLLOW UP NOTE    PATIENT:   Lindsay Lawson    DATE:      04/23/20    HISTORY AND CHIEF COMPLAINT:    Lindsay Lawson  is a 39 y.o.  year old Caucasian female who presents for a follow up of high risk screening. She has a family history of breast and colorectal cancers. She has fibrocystic changes bilaterally, breast pain, and breast masses consistent with cysts.    She does perform self breast exams.  She denies any skin changes, nipple discharge, nipple retraction, or recent breast trauma  .    PHYSICAL EXAMINATION:    Vitals:    04/23/20 0913   BP: 104/62   Pulse: 72   Resp: 16   Temp: 97 ??F (36.1 ??C)        Physical Exam  Vitals reviewed.   Constitutional:       Appearance: Normal appearance. She is well-developed.   HENT:      Head: Normocephalic and atraumatic.      Nose: Nose normal.   Eyes:      Conjunctiva/sclera: Conjunctivae normal.      Right eye: No hemorrhage.     Left eye: No hemorrhage.  Cardiovascular:      Rate and Rhythm: Normal rate.   Pulmonary:      Effort: Pulmonary effort is normal. No respiratory distress.   Chest:      Breasts: Breasts are symmetrical.         Right: Tenderness present. No inverted nipple, mass, nipple discharge or skin change.         Left: Tenderness present. No inverted nipple, mass, nipple discharge or skin change.      Comments: Fibrocystic changes bilaterally  Abdominal:      Tenderness: There is no abdominal tenderness.   Musculoskeletal:         General: Normal range of motion.      Cervical back: Normal range of motion and neck supple.      Comments: Normal Range of motion in upper and lower extremities. She uses a cane   Lymphadenopathy:      Cervical: No cervical adenopathy.      Right cervical: No superficial, deep or posterior cervical adenopathy.     Left cervical: No superficial, deep or posterior cervical adenopathy.      Upper Body:      Right upper body: No supraclavicular, axillary or pectoral adenopathy.      Left upper body: No supraclavicular,  axillary or pectoral adenopathy.   Skin:     General: Skin is warm and dry.      Findings: No abrasion, bruising, erythema or lesion.   Neurological:      Mental Status: She is alert and oriented to person, place, and time. She is not disoriented.   Psychiatric:         Speech: Speech normal.         Behavior: Behavior normal. Behavior is cooperative.         Thought Content: Thought content normal.         Judgment: Judgment normal.            IMAGING:    The imaging was reviewed.   US BREAST LIMITED LEFT    Result Date: 04/20/2020  BILATERAL DIAGNOSTIC MAMMOGRAMS AND 3-D BREAST TOMOSYNTHESIS WITH TARGETED BILATERAL BREAST ULTRASOUNDS: CLINICAL HISTORY: 70-YEAR-OLD WITH HISTORY OF BILATERAL BREAST CYSTS GROUPINGS. SHE PRESENTS WITH A NEW PALPABLE ABNORMALITY IN  THE LEFT BREAST AT THE 3:00 POSITION. FINDINGS:   3D breast tomosynthesis diagnostic mammography was performed and compared to previous studies from 03/16/2017, 02/19/2018, 02/21/2019.  There is heterogeneously dense breast parenchyma bilaterally which can obscure small breast lesions.   Right breast: On the diagnostic mammographic views no new masses, new focal asymmetries or suspicious calcifications. Targeted right breast ultrasound was performed at the 8:00 position approximately 4 cm per from the nipple and compared to previous ultrasound from 02/21/2019. Again demonstrated is a small grouping of cysts at the 8:00 position 4 cm from the nipple now measuring 6 x 5 x 2 mm which has decreased in size and compared to the previous study. There is an additional small grouping of cysts  just adjacent measuring 5 x 6 x 2 mm. No mammographic or ultrasonographic signs of right breast malignancy. Right breast BI-RADS 2: Benign findings. Left breast: There is a 3 cm lobulated mass in the upper mid left breast at a posterior third depth. It was present on studies dating back to 2018. Ultrasound of the left breast at the approximate 11:30 position 5 cm from the nipple  confirms a grouping of macrocysts measuring 2.9 cm in greatest dimension corresponding to the mammographic finding. Additionally on the mammographic images at the 11:30 positions slightly closer to the nipple there is a second smaller mass which remains stable compared to mammographic views dating back to 2018. Ultrasound in this region 4 cm from the nipple demonstrates a grouping of cysts measuring 8 to 9 mm. There is a small cyst measuring 7 to 8 mm at the 12:00 position in the retroareolar region seen on the ultrasound  and mammogram. Additionally there is a 4 to 5 cm cyst at the 3:00 position 7 cm from the nipple corresponding to a area of palpable concern. These cysts and groupings of macrocysts have probably benign ultrasound characteristics. Therefore, six-month follow-up is recommended to document stability. Left breast BI-RADS 3: Probably benign. CAD analysis was performed and used in the interpretation.      Right breast BI-RADS 2: Benign findings. Left breast BI-RADS 3: Probably benign findings. Six-month follow-up with mammography and breast ultrasound are recommended. BREAST DENSITY:  HETEROGENEOUS Board Certified Radiologists.  Accredited by the ACR and FDA. MAMMOGRAPHY IS VERY IMPORTANT TO YOUR HEALTH.  THE AMERICAN CANCER SOCIETY GUIDELINES RECOMMEND THAT WOMEN 8 YEARS OF AGE AND OLDER SHOULD HAVE A MAMMOGRAM EVERY YEAR.  A REMINDER LETTER WILL BE SENT AT THE APPROPRIATE TIME.      US BREAST LIMITED RIGHT    Result Date: 04/20/2020  BILATERAL DIAGNOSTIC MAMMOGRAMS AND 3-D BREAST TOMOSYNTHESIS WITH TARGETED BILATERAL BREAST ULTRASOUNDS: CLINICAL HISTORY: 46-YEAR-OLD WITH HISTORY OF BILATERAL BREAST CYSTS GROUPINGS. SHE PRESENTS WITH A NEW PALPABLE ABNORMALITY IN THE LEFT BREAST AT THE 3:00 POSITION. FINDINGS:   3D breast tomosynthesis diagnostic mammography was performed and compared to previous studies from 03/16/2017, 02/19/2018, 02/21/2019.  There is heterogeneously dense breast parenchyma bilaterally  which can obscure small breast lesions.   Right breast: On the diagnostic mammographic views no new masses, new focal asymmetries or suspicious calcifications. Targeted right breast ultrasound was performed at the 8:00 position approximately 4 cm per from the nipple and compared to previous ultrasound from 02/21/2019. Again demonstrated is a small grouping of cysts at the 8:00 position 4 cm from the nipple now measuring 6 x 5 x 2 mm which has decreased in size and compared to the previous study. There is an additional small grouping of cysts  just adjacent measuring 5 x 6 x 2 mm. No mammographic or ultrasonographic signs of right breast malignancy. Right breast BI-RADS 2: Benign findings. Left breast: There is a 3 cm lobulated mass in the upper mid left breast at a posterior third depth. It was present on studies dating back to 2018. Ultrasound of the left breast at the approximate 11:30 position 5 cm from the nipple confirms a grouping of macrocysts measuring 2.9 cm in greatest dimension corresponding to the mammographic finding. Additionally on the mammographic images at the 11:30 positions slightly closer to the nipple there is a second smaller mass which remains stable compared to mammographic views dating back to 2018. Ultrasound in this region 4 cm from the nipple demonstrates a grouping of cysts measuring 8 to 9 mm. There is a small cyst measuring 7 to 8 mm at the 12:00 position in the retroareolar region seen on the ultrasound  and mammogram. Additionally there is a 4 to 5 cm cyst at the 3:00 position 7 cm from the nipple corresponding to a area of palpable concern. These cysts and groupings of macrocysts have probably benign ultrasound characteristics. Therefore, six-month follow-up is recommended to document stability. Left breast BI-RADS 3: Probably benign. CAD analysis was performed and used in the interpretation.      Right breast BI-RADS 2: Benign findings. Left breast BI-RADS 3: Probably benign  findings. Six-month follow-up with mammography and breast ultrasound are recommended. BREAST DENSITY:  HETEROGENEOUS Board Certified Radiologists.  Accredited by the ACR and FDA. MAMMOGRAPHY IS VERY IMPORTANT TO YOUR HEALTH.  THE AMERICAN CANCER SOCIETY GUIDELINES RECOMMEND THAT WOMEN 21 YEARS OF AGE AND OLDER SHOULD HAVE A MAMMOGRAM EVERY YEAR.  A REMINDER LETTER WILL BE SENT AT THE APPROPRIATE TIME.      MAM TOMO DIGITAL DIAGNOSTIC BILATERAL    Result Date: 04/20/2020  BILATERAL DIAGNOSTIC MAMMOGRAMS AND 3-D BREAST TOMOSYNTHESIS WITH TARGETED BILATERAL BREAST ULTRASOUNDS: CLINICAL HISTORY: 19-YEAR-OLD WITH HISTORY OF BILATERAL BREAST CYSTS GROUPINGS. SHE PRESENTS WITH A NEW PALPABLE ABNORMALITY IN THE LEFT BREAST AT THE 3:00 POSITION. FINDINGS:   3D breast tomosynthesis diagnostic mammography was performed and compared to previous studies from 03/16/2017, 02/19/2018, 02/21/2019.  There is heterogeneously dense breast parenchyma bilaterally which can obscure small breast lesions.   Right breast: On the diagnostic mammographic views no new masses, new focal asymmetries or suspicious calcifications. Targeted right breast ultrasound was performed at the 8:00 position approximately 4 cm per from the nipple and compared to previous ultrasound from 02/21/2019. Again demonstrated is a small grouping of cysts at the 8:00 position 4 cm from the nipple now measuring 6 x 5 x 2 mm which has decreased in size and compared to the previous study. There is an additional small grouping of cysts  just adjacent measuring 5 x 6 x 2 mm. No mammographic or ultrasonographic signs of right breast malignancy. Right breast BI-RADS 2: Benign findings. Left breast: There is a 3 cm lobulated mass in the upper mid left breast at a posterior third depth. It was present on studies dating back to 2018. Ultrasound of the left breast at the approximate 11:30 position 5 cm from the nipple confirms a grouping of macrocysts measuring 2.9 cm in greatest  dimension corresponding to the mammographic finding. Additionally on the mammographic images at the 11:30 positions slightly closer to the nipple there is a second smaller mass which remains stable compared to mammographic views dating back to 2018. Ultrasound in this region 4 cm from the nipple demonstrates a  grouping of cysts measuring 8 to 9 mm. There is a small cyst measuring 7 to 8 mm at the 12:00 position in the retroareolar region seen on the ultrasound  and mammogram. Additionally there is a 4 to 5 cm cyst at the 3:00 position 7 cm from the nipple corresponding to a area of palpable concern. These cysts and groupings of macrocysts have probably benign ultrasound characteristics. Therefore, six-month follow-up is recommended to document stability. Left breast BI-RADS 3: Probably benign. CAD analysis was performed and used in the interpretation.      Right breast BI-RADS 2: Benign findings. Left breast BI-RADS 3: Probably benign findings. Six-month follow-up with mammography and breast ultrasound are recommended. BREAST DENSITY:  HETEROGENEOUS Board Certified Radiologists.  Accredited by the ACR and FDA. MAMMOGRAPHY IS VERY IMPORTANT TO YOUR HEALTH.  THE AMERICAN CANCER SOCIETY GUIDELINES RECOMMEND THAT WOMEN 1 YEARS OF AGE AND OLDER SHOULD HAVE A MAMMOGRAM EVERY YEAR.  A REMINDER LETTER WILL BE SENT AT THE APPROPRIATE TIME.        Assessment     IMPRESSION:      ICD-10-CM    1. Fibrocystic breast changes of both breasts  N60.11 MRI BREAST BILATERAL W CONTRAST    N60.12    2. At high risk for breast cancer  Z91.89 MRI BREAST BILATERAL W CONTRAST    lifetime percent 28%   3. Breast pain  N64.4 MRI BREAST BILATERAL W CONTRAST   4. Breast disorder  N64.9 US BREASt COMPLETE LEFT   5. Dense breast  R92.2         PLAN:    GENETIC COUNSELING RISK ASSESSMENT, DISCUSSION, AND SUGGESTED FOLLOW UP:    We reviewed the natural history and genetic etiology of sporadic, familial and hereditary cancer syndromes.    The  patient's personal and family history is potentially suggestive of: Hereditary Breast and Ovarian Cancer Syndrome.    The patient meets NCCN HBOC testing criteria based on her personal and family history. Her family history of breast and colorectal cancer.     We discussed that identification of a hereditary cancer syndrome may help her care providers tailor the patients medical management. If a mutation indicating Hereditary Breast and Ovarian Cancer Syndrome is detected in this case, the [place] recommendations would include increased cancer surveillance and prophylactic surgery options. If a mutation is detected, the patient will be referred back to the referring provider and to any additional appropriate care providers to discuss the relevant options.     Inheritance of hereditary cancer syndromes was discussed with the patient.    If a mutation is not found in the patient, this will decrease the likelihood of Hereditary Breast and Ovarian Cancer Syndrome as the explanation for her personal history. Cancer surveillance options would be discussed for the patient according to the appropriate standard [place] guidelines, with consideration of their personal and family history risk factors. In this case, the patient will be referred back to their care providers for discussions of management.     The patient was offered Integrated [facility] with [facility] through [employer].    After considering the risks, benefits, and limitations, the patient chose to pursue and provided informed consent for the following testing:  Breast cancer high risk panel through [facility].       Patient is doing well.  Recommend clinical breast exams in the breast surgical suite in 6 month(s)  Breast MRI in 6 month(s)  Breast Ultrasound in 6 month(s)  Recommend an active lifestyle, healthy diet, limited alcohol intake, achieve and maintain  a healthy BMI to optimize breast cancer outcomes / decrease risk of breast cancer.  Follow up with PCP    Total face to face time was 45 minutes with greater than 50% spent on counseling the patient or coordinating her care.     Dictated by Carolee Rota, MD  04/23/20     This note was partially generated using Dragon voice recognition system, and there may be some incorrect words, spellings, punctuation that were not noticed in checking the note before saving.

## 2020-04-26 ENCOUNTER — Encounter: Primary: Internal Medicine

## 2020-06-10 ENCOUNTER — Encounter

## 2020-07-19 NOTE — Progress Notes (Signed)
Due 10/2020

## 2020-10-26 ENCOUNTER — Encounter: Attending: Surgery | Primary: Internal Medicine

## 2020-12-02 NOTE — Telephone Encounter (Signed)
12-02-2020  Message left for patient to call back regarding scheduling her MRI of the breast.

## 2020-12-20 NOTE — Telephone Encounter (Signed)
12-20-2020  Left another message for patient to call back regarding scheduling her MRI of the breast.  Will continue to reach out and leave messages.

## 2020-12-28 NOTE — Telephone Encounter (Signed)
12-28-2020  Message left for patient to please call back regarding scheduling her MRI of the breast.

## 2021-04-12 ENCOUNTER — Emergency Department: Payer: BC Managed Care – PPO

## 2021-04-12 ENCOUNTER — Emergency Department
Admission: EM | Admit: 2021-04-12 | Discharge: 2021-04-12 | Disposition: A | Discharge status: Home / Self Care | Payer: BC Managed Care – PPO | Attending: Emergency Medicine | Admitting: Emergency Medicine

## 2021-04-12 ENCOUNTER — Other Ambulatory Visit: Payer: Self-pay

## 2021-04-12 DIAGNOSIS — S20214A Contusion of middle front wall of thorax, initial encounter: Secondary | ICD-10-CM

## 2021-04-12 DIAGNOSIS — S299XXA Unspecified injury of thorax, initial encounter: Secondary | ICD-10-CM | POA: Diagnosis present

## 2021-04-12 DIAGNOSIS — Z79899 Other long term (current) drug therapy: Secondary | ICD-10-CM | POA: Diagnosis not present

## 2021-04-12 DIAGNOSIS — F17200 Nicotine dependence, unspecified, uncomplicated: Secondary | ICD-10-CM | POA: Insufficient documentation

## 2021-04-12 DIAGNOSIS — I10 Essential (primary) hypertension: Secondary | ICD-10-CM | POA: Diagnosis not present

## 2021-04-12 LAB — TROPONIN I (HIGH SENSITIVITY): Troponin I (High Sensitivity): 4 ng/L (ref <18)

## 2021-04-12 MED ORDER — ACETAMINOPHEN 500 MG PO TABS
1000.0000 mg | ORAL_TABLET | Freq: Once | ORAL | Status: AC
  Administered 2021-04-12: 1000 mg via ORAL
  Filled 2021-04-12: qty 2

## 2021-04-12 MED ORDER — IBUPROFEN 400 MG PO TABS
400.0000 mg | ORAL_TABLET | Freq: Once | ORAL | Status: AC
  Administered 2021-04-12: 400 mg via ORAL
  Filled 2021-04-12: qty 1
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
## 2021-12-05 NOTE — Telephone Encounter (Signed)
12/05/21  Spoke with patient and she is ready to have MRI of breast. She needs diagnostic mamm follow up as well. Appt with Rosealee Albee, NP made for 12/08/21 for follow up and to obtain orders. Patient aware. Encouraged to call with any questions or concerns.

## 2021-12-08 ENCOUNTER — Ambulatory Visit: Admit: 2021-12-08 | Discharge: 2021-12-08 | Payer: PRIVATE HEALTH INSURANCE | Primary: Internal Medicine

## 2021-12-08 DIAGNOSIS — N6452 Nipple discharge: Secondary | ICD-10-CM

## 2021-12-08 DIAGNOSIS — Z1239 Encounter for other screening for malignant neoplasm of breast: Secondary | ICD-10-CM

## 2021-12-08 NOTE — Progress Notes (Signed)
FOLLOW UP NOTE    PATIENT:   Lindsay Lawson    DATE:      12/08/21    HISTORY AND CHIEF COMPLAINT:    Lindsay Lawson  is a 41 y.o.  year old Caucasian female who presents for a follow up of high risk for breast cancer.    She does perform self breast exams.  She denies any breast pain, masses, skin changes, nipple retraction, or recent breast trauma .  She states that her left breast is more "lumpy" than usual and her left nipple is leaking yellow discharge the past few weeks.    Lifetime risk 28% of acquiring breast cancer. 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 Lindsay Lawson has a history of lupus and some other autoimmune diseases.    I offered and ordered [place] genetic testing today.     Review of Systems   Constitutional:  Negative for appetite change.   HENT:  Negative for dental problem, nosebleeds, sinus pressure and sinus pain.    Respiratory:  Positive for shortness of breath. Negative for cough and chest tightness.         Sees PCP tomorrow   Cardiovascular:  Positive for palpitations. Negative for chest pain.   Gastrointestinal:  Negative for blood in stool, constipation, diarrhea and nausea.   Genitourinary:  Negative for difficulty urinating.   Musculoskeletal:  Positive for arthralgias, back pain, gait problem, joint swelling and myalgias. Negative for neck pain.   Skin:  Negative for rash and wound.   Allergic/Immunologic: Positive for food allergies. Negative for environmental allergies.   Neurological:  Negative for dizziness and headaches.   Hematological:  Negative for adenopathy.   Psychiatric/Behavioral:  Positive for dysphoric mood and sleep disturbance. Negative for suicidal ideas. The patient is nervous/anxious.      PHYSICAL EXAMINATION:    Vitals:    12/08/21 1501   BP: 112/69   Pulse: 70   Resp: 16   Temp: 97.3 ??F (36.3 ??C)   SpO2: 99%   Weight: 175 lb (79.4 kg)        Physical Exam  Constitutional:       Appearance: Normal appearance. She is normal weight.   HENT:      Head:  Normocephalic and atraumatic.      Right Ear: External ear normal.      Left Ear: External ear normal.      Nose: Nose normal.   Eyes:      Conjunctiva/sclera: Conjunctivae normal.      Right eye: No hemorrhage.     Left eye: No hemorrhage.  Pulmonary:      Effort: Pulmonary effort is normal.   Chest:   Breasts:     Right: Normal.      Left: Nipple discharge present.      Comments: Yellow nipple discharge  Abdominal:      General: Abdomen is flat.   Musculoskeletal:         General: Normal range of motion.      Cervical back: Normal range of motion and neck supple.   Lymphadenopathy:      Cervical: No cervical adenopathy.      Upper Body:      Right upper body: No supraclavicular, axillary or pectoral adenopathy.      Left upper body: No supraclavicular, axillary or pectoral adenopathy.   Skin:     General: Skin is warm and dry.   Neurological:      General: No focal  deficit present.      Mental Status: She is alert and oriented to person, place, and time.      Gait: Gait is intact.   Psychiatric:         Mood and Affect: Mood normal.         Behavior: Behavior normal. Behavior is cooperative.     Lindsay Lawson is a 41 y.o.  year old pleasant Caucasian  female in no apparent distress.  The patient was examined in the seated and supine position with the motion of the pectoralis muscles bilaterally.  There are no masses visualized in the neck.  There is no cervical, supraclavicular or axillary lymphadenopathy noted bilaterally.  Bilateral breasts are symmetrical. Bilateral nipples are everted.  No dimpling, indentation, or nipple retraction noted bilaterally.  She has left nipple discharge, yellow in color. After detailed breast examination no palpable abnormalities or concerning thickening are noted bilaterally.  She is alert, oriented, normal speech, no focal findings or movement disorder noted, neck supple without rigidity    IMAGING:    The imaging was reviewed.   Last Mammogram was 2021.    Assessment      IMPRESSION:      ICD-10-CM    1. Breast cancer screening, high risk patient  Z12.39 MAM TOMO DIGITAL DIAGNOSTIC BILATERAL     MRI BREAST BILATERAL W WO CONTRAST     [facility] Multi-Cancer (2+38)           PLAN:    Patient is doing well.  Recommend clinical breast exams in the breast surgical suite in 6 month(s)  Mammography in 1 day(s)  Breast MRI in 6 month(s)  Genetics Referral  Recommend an active lifestyle, healthy diet, limited alcohol intake, achieve and maintain a healthy BMI to optimize breast cancer outcomes / decrease risk of breast cancer.    Total face to face time was 39 minutes today with greater than 50% spent on counseling the patient or coordinating her care, reviewing records prior to visit, history, physical exam, reviewing imaging. Discussion regarding natural history and potential progression of breast changes, timing of surveillance visits, timing and type of surveillance imaging, life-style modification, exercise, and limiting alcohol intake were performed today.     Dictated by Rosealee Albee, APRN,CNP  12/08/21

## 2022-08-19 ENCOUNTER — Other Ambulatory Visit (HOSPITAL_COMMUNITY): Payer: Self-pay | Admitting: Family

## 2022-10-09 ENCOUNTER — Inpatient Hospital Stay: Admit: 2022-10-09 | Payer: PRIVATE HEALTH INSURANCE | Primary: Internal Medicine

## 2022-10-09 DIAGNOSIS — Z1239 Encounter for other screening for malignant neoplasm of breast: Secondary | ICD-10-CM

## 2022-10-09 MED ORDER — GADOBENATE DIMEGLUMINE 529 MG/ML IV SOLN
529 MG/ML | Freq: Once | INTRAVENOUS | Status: AC | PRN
Start: 2022-10-09 — End: 2022-10-09
  Administered 2022-10-09: 14:00:00 20 mL via INTRAVENOUS

## 2022-11-01 ENCOUNTER — Other Ambulatory Visit: Payer: Self-pay | Admitting: Family

## 2022-11-01 DIAGNOSIS — Z1231 Encounter for screening mammogram for malignant neoplasm of breast: Secondary | ICD-10-CM

## 2022-12-21 ENCOUNTER — Other Ambulatory Visit (HOSPITAL_COMMUNITY): Payer: Self-pay

## 2022-12-21 MED ORDER — NORETHINDRONE-ETH ESTRADIOL 1-35 MG-MCG PO TABS: 1.0000 | ORAL_TABLET | Freq: Every day | ORAL | 3 refills | Status: AC

## 2022-12-21 MED ORDER — ESTROGENS CONJUGATED 1.25 MG PO TABS: ORAL_TABLET | ORAL | 0 refills | Status: AC

## 2022-12-21 MED ORDER — LISINOPRIL-HYDROCHLOROTHIAZIDE 10-12.5 MG PO TABS
1.0000 | ORAL_TABLET | Freq: Every day | ORAL | 1 refills | Status: DC
  Filled 2023-01-17: qty 90, 90d supply, fill #0
  Filled 2023-05-08: qty 30, 30d supply, fill #1

## 2022-12-22 ENCOUNTER — Other Ambulatory Visit (HOSPITAL_COMMUNITY): Payer: Self-pay

## 2022-12-25 ENCOUNTER — Inpatient Hospital Stay
Admit: 2022-12-25 | Discharge: 2022-12-25 | Payer: PRIVATE HEALTH INSURANCE | Attending: Nurse Practitioner | Primary: Internal Medicine

## 2022-12-25 ENCOUNTER — Encounter: Payer: PRIVATE HEALTH INSURANCE | Attending: Surgery | Primary: Internal Medicine

## 2022-12-25 DIAGNOSIS — Z803 Family history of malignant neoplasm of breast: Secondary | ICD-10-CM

## 2022-12-25 NOTE — Progress Notes (Signed)
FOLLOW UP VISIT    REASON FOR VISIT:   Chief Complaint   Patient presents with    Follow-up     Routine high risk follow up        DIAGNOSIS:    ICD-10-CM    1. 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 Caucasian female who presents today for routine follow up. Today she states she is doing fairly well. Reports painful mass in left breast ~3 o'clock. States mass has been there a couple years, now increased in size and painful. Also reports mostly non-spontaneous left breast white-yellow nipple discharge. Notices mostly if presses on breast. Denies any bloody or serous discharge. Denies injury/trauma to breast. Wears sports bra. Drinks caffeine. Takes Tylenol prn. No menses since ablation in 2018, has ovaries. Most recent imaging MRI on 10/09/22, BI-RADS 1. Last mammogram 2021 showing multiple cysts in left breast, patient states was always just sent for MRI so hasn't had recent mammogram. States had genetic testing done this past year and was told she was positive for a genetic mutation. States she spoke with genetic counselor over the   phone.     She denies any other breast complaints or concerns. She denies new breast masses, new skin changes, axillary masses.  Underwent appendectomy 06/2022 and plans upcoming cholecystectomy. She otherwise denies any new medical problems or changes in family history. Goes to [employer]/exercise 3x/week. Kids ages 39y (female), 19y (female), 46y (female).     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)     Lupus (systemic  lupus erythematosus) (HCC)     MDRO (multiple drug resistant organisms) resistance 2010    camping /bite by spider rightbuttock crease / I & D / dx MRSA    Migraines     PTSD (post-traumatic stress disorder)     Scoliosis     Secondary osteoarthritis     Ureter, stricture        PAST SURGICAL HISTORY  Past Surgical History:   Procedure Laterality Date    APPENDECTOMY  06/2022    CESAREAN SECTION  2011    CHOLECYSTECTOMY      COLONOSCOPY      ENDOSCOPY, COLON, DIAGNOSTIC      KNEE SURGERY Right     nerve block---     PATELLA SURGERY Right     patella alignment    PR HYSTEROSCOPY ENDOMETRIAL ABLATION N/A 04/25/2017    D&C  HYSTEROSCOPY ABLATION performed by Dawayne Cirri, MD at [place]  2011    following csection    URETER STENT PLACEMENT Bilateral     x6        SOCIAL HISTORY  Social History     Socioeconomic History    Marital  status: Married   Tobacco Use    Smoking status: Former     Current packs/day: 0.00     Average packs/day: 0.5 packs/day for 5.0 years (2.5 ttl pk-yrs)     Types: Cigarettes     Start date: 04/18/2001     Quit date: 04/18/2006     Years since quitting: 16.7    Smokeless tobacco: Never    Tobacco comments:     quit 2008   Vaping Use    Vaping Use: Never used   Substance and Sexual Activity    Alcohol use: No    Drug use: Yes     Types: Marijuana (Weed)     Comment: occas chews a gummie cannibus    Sexual activity: Yes     Partners: Male        FAMILY HISTORY  Family History   Problem Relation Age of Onset    Arthritis Mother         RA    Other Mother         Hep C    Diabetes Father     Obesity Father     Sleep Apnea Father     Hypertension Father     Elevated Lipids Father     Other Father         had HEP C    Liver Cancer Father     Other Maternal Grandmother         aspestos exposure     Cancer Maternal Grandfather         aspestos cancer    Kidney Disease Paternal Grandmother     Breast Cancer Paternal Grandmother     Diabetes Paternal Grandfather 61    No Known Problems  Brother     Seizures Daughter     Depression Daughter     Anxiety Disorder Daughter     Anxiety Disorder Son     Seizures Son     Breast Cancer Paternal Aunt     Breast Cancer Paternal Aunt         ALLERGIES:  Allergies   Allergen Reactions    Adhesive Tape Hives and Other (See Comments)     Skin irriation/ POLYURTHEANE 39    Cefaclor Hives    Ciprofloxacin Hives     NOTE 04/05/2020: "She was started on IV Cipro and per MD completed most of dose but then developed hives up right arm where the IV was. She was given benadryl which improved her symptoms. This reaction was noted on 03/04/20 when she received IV cipro as well. She did tolerate a course of oral cipro on 03/17/20"    Nsaids Other (See Comments)     Patient cannot take Nsaids. Has IBS    Other      LATEX FREE cause skin burns    Polyurethane [Urethane] Other (See Comments)     Literally it leaves burn marks    Soap      Dove , [place] soaps causes skin irritation    Kiwi Extract Rash        MEDICATIONS:    Current Outpatient Medications:     atorvastatin (LIPITOR) 10 MG tablet, Take 1 tablet by mouth daily, Disp: , Rfl:     promethazine (PHENERGAN) 25 MG tablet, Take 1 tablet by mouth every 6 hours as needed, Disp: , Rfl:     lansoprazole (PREVACID) 30 MG delayed release capsule, Take 1 capsule by mouth daily, Disp: , Rfl:  methocarbamol (ROBAXIN) 500 MG tablet, Take 2 tablets by mouth 4 times daily, Disp: , Rfl:     triamcinolone (KENALOG) 0.1 % ointment, Apply 1 Dose topically 3 times daily as needed, Disp: , Rfl:     prochlorperazine (COMPAZINE) 5 MG tablet, Take 1 tablet by mouth every 6 hours as needed, Disp: , Rfl:     lidocaine (LIDODERM) 5 %, Place 1 patch onto the skin daily Remove patch after 12 hours, Disp: , Rfl:     azelastine (ASTELIN) 0.1 % nasal spray, 2 sprays by NOT APPLICABLE route 2 times daily, Disp: , Rfl:     acetaminophen (TYLENOL) 500 MG tablet, Take 1 tablet by mouth every 6 hours as needed, Disp: , Rfl:     tamsulosin (FLOMAX) 0.4 MG  capsule, Take 1 capsule by mouth daily as needed, Disp: , Rfl: 3    triamcinolone (KENALOG) 0.025 % LOTN lotion, Apply 1 Dose topically daily, Disp: , Rfl:     levothyroxine (SYNTHROID) 100 MCG tablet, Take 1 tablet by mouth daily, Disp: , Rfl:     pregabalin (LYRICA) 100 MG capsule, Take 2 capsules by mouth 2 times daily., Disp: , Rfl: 5    Ergocalciferol 50 MCG (2000 UT) CAPS, Take 50,000 Units by mouth Twice a Week, Disp: , Rfl:     ALPRAZolam (XANAX) 0.5 MG tablet, Take 1 tablet by mouth 3 times daily., Disp: , Rfl: 0    baclofen (LIORESAL) 10 MG tablet, Take 1 tablet by mouth 3 times daily as needed, Disp: , Rfl:     vitamin D-3 (CHOLECALCIFEROL) 5000 units TABS, Take 1 tablet by mouth daily, Disp: , Rfl:     SUMAtriptan (IMITREX) 100 MG tablet, TAKE 1 TABLET AS NEEDED (max 2 (TWO) per day/12 per month), Disp: , Rfl:     EPINEPHrine (EPIPEN) 0.3 MG/0.3ML SOAJ injection, Inject 0.3 mLs into the muscle, Disp: , Rfl:     citalopram (CELEXA) 40 MG tablet, Take 1 tablet by mouth once daily., Disp: , Rfl:     dicyclomine (BENTYL) 20 MG tablet, TAKE 1 TABLET BY MOUTH THREE TIMES DAILY AS NEEDED, Disp: , Rfl:     glycopyrrolate (ROBINUL) 2 MG tablet, TAKE 1 TABLET TWICE DAILY, Disp: , Rfl:     tiZANidine (ZANAFLEX) 4 MG tablet, TAKE 1 TABLET EVERY 8 HOURS AS NEEDED, Disp: , Rfl:     ondansetron (ZOFRAN-ODT) 4 MG disintegrating tablet, Take 1 tablet by mouth every 8 hours as needed, Disp: , Rfl:     omeprazole (PRILOSEC) 40 MG delayed release capsule, TAKE 1 CAPSULE TWICE DAILY, Disp: , Rfl:     tretinoin (RETIN-A) 0.025 % cream, Use sparingly to acne-prone areas at bedtime., Disp: , Rfl:     oxyCODONE-acetaminophen (PERCOCET) 5-325 MG per tablet, Take 1 tablet by mouth every 6 hours as needed. (Patient not taking: Reported on 12/25/2022), Disp: , Rfl:     oxyCODONE (ROXICODONE) 5 MG immediate release tablet, Take 1 tablet by mouth every 6 hours as needed. (Patient not taking: Reported on 12/25/2022), Disp: , Rfl:      mirtazapine (REMERON) 30 MG tablet, Take 30 mg by mouth, Disp: , Rfl:     predniSONE (DELTASONE) 5 MG tablet, Day 1=6tabs with food, Day 2=5tabs, Day 3=4tabs, Day 4=3tabs, Day 5=2tabs, then take 1-2tabs daily thereafter if needed, No NSAIDs on med (Patient not taking: Reported on 12/25/2022), Disp: , Rfl: 1    amitriptyline (ELAVIL) 10 MG tablet, Take 1-2 tablets by mouth daily  at bedtime. (Patient not taking: Reported on 12/25/2022), Disp: , Rfl:     predniSONE (DELTASONE) 20 MG tablet, Take 40 mg by mouth as needed  (Patient not taking: Reported on 12/25/2022), Disp: , Rfl:     folic acid (FOLVITE) 1 MG tablet, Take 1 mg by mouth daily  (Patient not taking: Reported on 12/25/2022), Disp: , Rfl:     REVIEW OF SYSTEMS:   Review of Systems   Constitutional:  Negative for chills and fever.   HENT:  Negative for facial swelling and trouble swallowing.    Eyes:  Negative for discharge and visual disturbance.   Respiratory:  Negative for cough and shortness of breath.    Cardiovascular:  Negative for chest pain and leg swelling.   Gastrointestinal:  Negative for abdominal pain and vomiting.   Genitourinary:  Negative for difficulty urinating and vaginal bleeding.   Musculoskeletal:  Negative for gait problem.   Neurological:  Negative for tremors and weakness.   Hematological:  Negative for adenopathy.   Psychiatric/Behavioral:  Negative for agitation and confusion.         PHYSICAL EXAM:  BP 99/73 (Site: Right Upper Arm, Position: Sitting, Cuff Size: Medium Adult)   Pulse 73   Temp 97 F (36.1 C)   Resp 14   Wt 75.8 kg (167 lb)   SpO2 99%   BMI 23.96 kg/m   General Appearance: Well developed, well nourished, appears stated age, in no acute distress  Neurological: Alert and oriented to person, place, and time. Normal speech. Normal gait and balance  Psychiatric: Normal mood and affect. Attention span and thought content normal.   HEENT: Normocephalic, atraumatic. Clear conjunctiva and sclera non-icteric  Neck:  Supple, symmetrical, trachea midline, no cervical/supraclavicular adenopathy  CV: Regular rate and rhythm  Pulmonary: Normal respiratory rate and effort. No respiratory distress  Musculoskeletal: Normal range of motion in upper and lower extremities  Extremities: No significant peripheral edema  Skin: Skin is warm and dry. No rashes or lesions  GI/Abdomen: Soft, non-tender. No masses or organomegaly  Breasts: Breasts are symmetrical  Right Breast: The skin, nipple, and areola appear normal. There is no skin dimpling with movement of the pectoralis. There is no nipple retraction. There is no nipple discharge elicited. There are no dominant masses in the breast. The axillary tail is normal.  Left Breast: The skin, nipple, and areola appear normal. There is no skin dimpling with movement of the pectoralis. There is no nipple retraction. There is no nipple discharge elicited. There are no dominant masses in the breast. The axillary tail is normal.  Lymph Nodes:   Right regional lymph nodes: non-palpable axillary nodes and non-palpable  supraclavicular nodes  Left regional lymph nodes: non-palpable axillary nodes and non-palpable supraclavicular nodes    IMAGING:  Imaging reviewed and discussed with patient:    EXAMINATION:  MRI OF THE BILATERAL BREASTS WITHOUT AND WITH CONTRAST 10/09/2022 7:44 am:     TECHNIQUE:  Multiplanar multisequence MRI of the bilateral breasts were performed without  and with the administration of intravenous contrast.     Data analysis was performed with color parametric mapping, image subtraction,  and 3D reconstructions.     COMPARISON:  Mammography from 04/20/2020     HISTORY:  ORDERING SYSTEM PROVIDED HISTORY: Breast cancer screening, high risk patient  TECHNOLOGIST PROVIDED HISTORY:  STAT Creatinine as needed:->No  What reading provider will be dictating this exam?->CRC     High risk screening     FINDINGS:  Dense glandular tissue is identified in both breasts.  There are  mild-to-moderate  punctate foci background enhancement noted in both breasts.  No suspicious areas of enhancement are noted in either breast or either  axilla.     IMPRESSION:  No MR evidence of malignancy.     BIRADS:  BIRADS - CATEGORY 1     Negative MR. Normal interval follow-up is recommended in 12 months.     OVERALL ASSESSMENT - NEGATIVE    Mammogram Result (most recent):  MAM TOMO DIGITAL DIAGNOSTIC BILATERAL 04/20/2020    Narrative  BILATERAL DIAGNOSTIC MAMMOGRAMS AND 3-D BREAST TOMOSYNTHESIS WITH TARGETED BILATERAL BREAST ULTRASOUNDS:    CLINICAL HISTORY: 56-YEAR-OLD WITH HISTORY OF BILATERAL BREAST CYSTS GROUPINGS. SHE PRESENTS WITH A NEW PALPABLE ABNORMALITY IN THE LEFT BREAST AT THE 3:00 POSITION.    FINDINGS:   3D breast tomosynthesis diagnostic mammography was performed and compared to previous studies from 03/16/2017, 02/19/2018, 02/21/2019.  There is heterogeneously dense breast parenchyma bilaterally which can obscure small breast lesions.    Right breast: On the diagnostic mammographic views no new masses, new focal asymmetries or suspicious calcifications. Targeted right breast ultrasound was performed at the 8:00 position approximately 4 cm per from the nipple and compared to previous  ultrasound from 02/21/2019. Again demonstrated is a small grouping of cysts at the 8:00 position 4 cm from the nipple now measuring 6 x 5 x 2 mm which has decreased in size and compared to the previous study. There is an additional small grouping of cysts  just adjacent measuring 5 x 6 x 2 mm. No mammographic or ultrasonographic signs of right breast malignancy. Right breast BI-RADS 2: Benign findings.    Left breast: There is a 3 cm lobulated mass in the upper mid left breast at a posterior third depth. It was present on studies dating back to 2018. Ultrasound of the left breast at the approximate 11:30 position 5 cm from the nipple confirms a grouping  of macrocysts measuring 2.9 cm in greatest dimension corresponding to the  mammographic finding. Additionally on the mammographic images at the 11:30 positions slightly closer to the nipple there is a second smaller mass which remains stable compared to  mammographic views dating back to 2018. Ultrasound in this region 4 cm from the nipple demonstrates a grouping of cysts measuring 8 to 9 mm. There is a small cyst measuring 7 to 8 mm at the 12:00 position in the retroareolar region seen on the ultrasound  and mammogram. Additionally there is a 4 to 5 cm cyst at the 3:00 position 7 cm from the nipple corresponding to a area of palpable concern. These cysts and groupings of macrocysts have probably benign ultrasound characteristics. Therefore, six-month  follow-up is recommended to document stability. Left breast BI-RADS 3: Probably benign.    CAD analysis was performed and used in the interpretation.    Impression  Right breast BI-RADS 2: Benign findings.    Left breast BI-RADS 3: Probably benign findings. Six-month follow-up with mammography and breast ultrasound are recommended.    BREAST DENSITY:  HETEROGENEOUS        Board Certified Radiologists.  Accredited by the ACR and FDA.    MAMMOGRAPHY IS VERY IMPORTANT TO YOUR HEALTH.  THE AMERICAN CANCER SOCIETY GUIDELINES RECOMMEND THAT WOMEN 51 YEARS OF AGE AND OLDER SHOULD HAVE A MAMMOGRAM EVERY YEAR.    A REMINDER LETTER WILL BE SENT AT THE APPROPRIATE TIME.        IMPRESSION:  ICD-10-CM    1. Mass of upper outer quadrant of left breast  N63.21 MAM TOMO DIGITAL DIAGNOSTIC BILATERAL     US BREAST COMPLETE LEFT           PLAN:  Recommend bilateral diagnostic mammogram and left breast US now. Orders placed.   Continue breast awareness and clinical breast exam every 6 months.   Encouraged healthy lifestyle (I.e. regular exercise, healthy diet, maintain healthy BMI, limit alcohol, smoking cessation) for breast cancer risk reduction  Copy genetics report obtained following visit due to computer outage. 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 See telephone encounter for follow up with patient.   Recommend RTC in 6 months for clinical exam    She was advised to call back if any significant changes in her exam or symptoms in the meantime    ORDERS  Orders Placed This Encounter   Procedures    MAM TOMO DIGITAL DIAGNOSTIC BILATERAL     Standing Status:   Future     Standing Expiration Date:   02/24/2024     Order Specific Question:   Reason for exam:     Answer:   left 3 o'clock mass per pt    US BREAST COMPLETE LEFT     Standing Status:   Future     Standing Expiration Date:   12/25/2023     Scheduling Instructions:      Additional imaging, biopsy per radiology       The patient was encouraged to call back with any questions or concerns regarding her care.    Tyna Jaksch, APRN-CNP  [facility], Breast Surgery and Oncology Support  [phone] (phone)

## 2022-12-26 NOTE — Telephone Encounter (Signed)
Left message on voicemail re: copy genetic results obtained from [facility]. 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 Requested patient contact us at her convenience to review results. Discussed could impact siblings, father, and would not impact son until later in life. She previously reviewed with genetic counselor but results were not available yesterday due to computer/internet outage.

## 2023-01-03 ENCOUNTER — Encounter: Payer: PRIVATE HEALTH INSURANCE | Primary: Internal Medicine

## 2023-01-09 ENCOUNTER — Ambulatory Visit: Payer: PRIVATE HEALTH INSURANCE | Primary: Internal Medicine

## 2023-01-17 ENCOUNTER — Other Ambulatory Visit (HOSPITAL_COMMUNITY): Payer: Self-pay

## 2023-01-19 ENCOUNTER — Other Ambulatory Visit (HOSPITAL_COMMUNITY): Payer: Self-pay

## 2023-01-26 ENCOUNTER — Other Ambulatory Visit: Payer: Self-pay

## 2023-05-08 ENCOUNTER — Other Ambulatory Visit (HOSPITAL_COMMUNITY): Payer: Self-pay

## 2023-05-31 ENCOUNTER — Other Ambulatory Visit (HOSPITAL_COMMUNITY): Payer: Self-pay

## 2023-05-31 MED ORDER — LISINOPRIL-HYDROCHLOROTHIAZIDE 10-12.5 MG PO TABS
1.0000 | ORAL_TABLET | Freq: Every day | ORAL | 1 refills | Status: DC
  Filled 2023-05-31: qty 90, 90d supply, fill #0
  Filled 2023-08-29: qty 90, 90d supply, fill #1

## 2023-06-01 ENCOUNTER — Other Ambulatory Visit (HOSPITAL_COMMUNITY): Payer: Self-pay

## 2023-06-28 ENCOUNTER — Encounter: Payer: PRIVATE HEALTH INSURANCE | Attending: Nurse Practitioner | Primary: Internal Medicine

## 2023-11-27 ENCOUNTER — Other Ambulatory Visit: Payer: Self-pay

## 2023-11-27 ENCOUNTER — Other Ambulatory Visit (HOSPITAL_COMMUNITY): Payer: Self-pay

## 2023-11-27 MED ORDER — LISINOPRIL-HYDROCHLOROTHIAZIDE 10-12.5 MG PO TABS
1.0000 | ORAL_TABLET | Freq: Every day | ORAL | 1 refills | Status: AC
  Filled 2023-11-27: qty 90, 90d supply, fill #0
  Filled 2024-02-19: qty 90, 90d supply, fill #1

## 2024-01-24 ENCOUNTER — Other Ambulatory Visit: Payer: Self-pay | Admitting: Family

## 2024-01-24 DIAGNOSIS — Z1231 Encounter for screening mammogram for malignant neoplasm of breast: Secondary | ICD-10-CM

## 2024-02-07 ENCOUNTER — Inpatient Hospital Stay: Admission: RE | Admit: 2024-02-07 | Source: Ambulatory Visit

## 2024-05-19 ENCOUNTER — Other Ambulatory Visit (HOSPITAL_COMMUNITY): Payer: Self-pay

## 2024-05-20 ENCOUNTER — Other Ambulatory Visit (HOSPITAL_COMMUNITY): Payer: Self-pay

## 2024-05-21 ENCOUNTER — Other Ambulatory Visit (HOSPITAL_COMMUNITY): Payer: Self-pay

## 2024-05-22 ENCOUNTER — Other Ambulatory Visit (HOSPITAL_COMMUNITY): Payer: Self-pay

## 2024-05-26 ENCOUNTER — Other Ambulatory Visit (HOSPITAL_COMMUNITY): Payer: Self-pay

## 2024-05-29 ENCOUNTER — Other Ambulatory Visit (HOSPITAL_COMMUNITY): Payer: Self-pay

## 2024-05-30 ENCOUNTER — Other Ambulatory Visit (HOSPITAL_COMMUNITY): Payer: Self-pay

## 2024-06-03 ENCOUNTER — Other Ambulatory Visit (HOSPITAL_COMMUNITY): Payer: Self-pay

## 2024-06-10 ENCOUNTER — Other Ambulatory Visit (HOSPITAL_COMMUNITY): Payer: Self-pay

## 2024-06-11 ENCOUNTER — Other Ambulatory Visit (HOSPITAL_COMMUNITY): Payer: Self-pay

## 2024-08-08 ENCOUNTER — Other Ambulatory Visit (HOSPITAL_COMMUNITY): Payer: Self-pay

## 2024-08-08 MED ORDER — LISINOPRIL-HYDROCHLOROTHIAZIDE 10-12.5 MG PO TABS
1.0000 | ORAL_TABLET | Freq: Every day | ORAL | 0 refills | Status: DC
  Filled 2024-08-08: qty 30, 30d supply, fill #0

## 2024-08-11 ENCOUNTER — Other Ambulatory Visit (HOSPITAL_COMMUNITY): Payer: Self-pay

## 2024-09-05 ENCOUNTER — Other Ambulatory Visit (HOSPITAL_COMMUNITY): Payer: Self-pay

## 2024-09-05 MED ORDER — LISINOPRIL-HYDROCHLOROTHIAZIDE 10-12.5 MG PO TABS
1.0000 | ORAL_TABLET | Freq: Every day | ORAL | 0 refills | Status: DC
  Filled 2024-09-05: qty 60, 60d supply, fill #0

## 2024-09-06 ENCOUNTER — Other Ambulatory Visit (HOSPITAL_COMMUNITY): Payer: Self-pay

## 2024-11-04 ENCOUNTER — Other Ambulatory Visit (HOSPITAL_COMMUNITY): Payer: Self-pay

## 2024-11-06 ENCOUNTER — Other Ambulatory Visit: Payer: Self-pay

## 2024-11-06 ENCOUNTER — Other Ambulatory Visit (HOSPITAL_COMMUNITY): Payer: Self-pay

## 2024-11-06 MED ORDER — LISINOPRIL-HYDROCHLOROTHIAZIDE 10-12.5 MG PO TABS
1.0000 | ORAL_TABLET | Freq: Every day | ORAL | 0 refills | Status: AC
  Filled 2024-11-06: qty 60, 60d supply, fill #0
  Filled 2024-11-07: qty 90, 90d supply, fill #0

## 2024-11-07 ENCOUNTER — Other Ambulatory Visit (HOSPITAL_BASED_OUTPATIENT_CLINIC_OR_DEPARTMENT_OTHER): Payer: Self-pay

## 2024-11-07 ENCOUNTER — Other Ambulatory Visit: Payer: Self-pay
